# Patient Record
Sex: Male | Born: 1975 | Hispanic: Yes | Marital: Married | Smoking: Former smoker
Health system: Southern US, Community
[De-identification: ages and names within clinical notes are randomized; demographics above are authoritative.]

## PROBLEM LIST (undated history)

## (undated) DIAGNOSIS — R7303 Prediabetes: Secondary | ICD-10-CM

## (undated) DIAGNOSIS — B159 Hepatitis A without hepatic coma: Secondary | ICD-10-CM

## (undated) DIAGNOSIS — I10 Essential (primary) hypertension: Secondary | ICD-10-CM

## (undated) HISTORY — PX: NO PAST SURGERIES: SHX2092

---

## 2008-03-29 ENCOUNTER — Ambulatory Visit: Payer: Self-pay | Admitting: Family Medicine

## 2017-10-06 ENCOUNTER — Other Ambulatory Visit: Payer: Self-pay | Admitting: Family Medicine

## 2017-10-06 DIAGNOSIS — R945 Abnormal results of liver function studies: Secondary | ICD-10-CM

## 2017-10-13 ENCOUNTER — Ambulatory Visit: Payer: Self-pay

## 2017-10-13 ENCOUNTER — Ambulatory Visit
Admission: RE | Admit: 2017-10-13 | Discharge: 2017-10-13 | Disposition: A | Discharge status: Home / Self Care | Payer: BLUE CROSS/BLUE SHIELD | Source: Ambulatory Visit | Attending: Family Medicine | Admitting: Family Medicine

## 2017-10-13 ENCOUNTER — Other Ambulatory Visit: Payer: Self-pay | Admitting: Family Medicine

## 2017-10-13 DIAGNOSIS — D1809 Hemangioma of other sites: Secondary | ICD-10-CM | POA: Diagnosis not present

## 2017-10-13 DIAGNOSIS — R945 Abnormal results of liver function studies: Secondary | ICD-10-CM | POA: Insufficient documentation

## 2017-10-13 DIAGNOSIS — R932 Abnormal findings on diagnostic imaging of liver and biliary tract: Secondary | ICD-10-CM | POA: Diagnosis not present

## 2018-10-26 IMAGING — US US ABDOMEN COMPLETE
1 series · 13 of 25 positions shown · non-contrast
Comparison: None.

CLINICAL DATA: Abnormal liver function tests

EXAM:
ABDOMEN ULTRASOUND COMPLETE

[Series 1: us abdomen complete · 0.26mm/px · 13 of 114 slices shown]
[im 1/114]
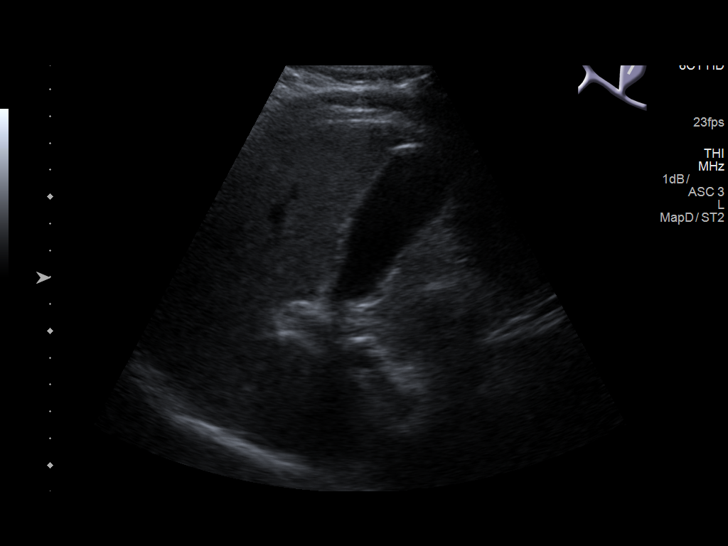
[im 10/114]
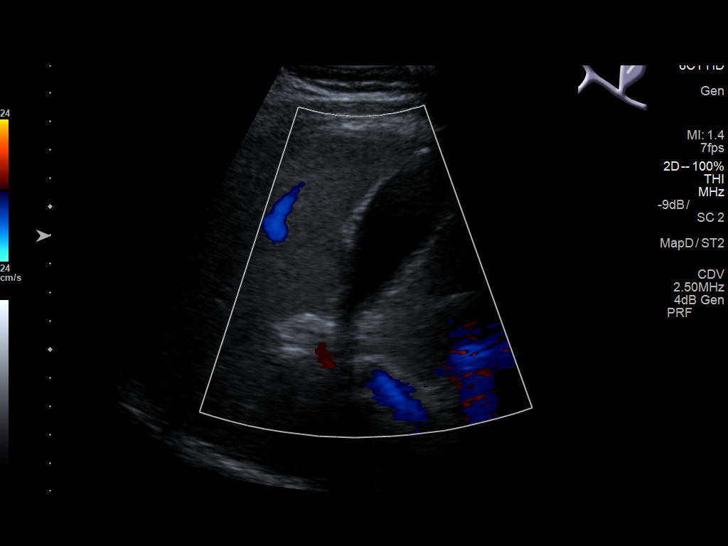
[im 19/114]
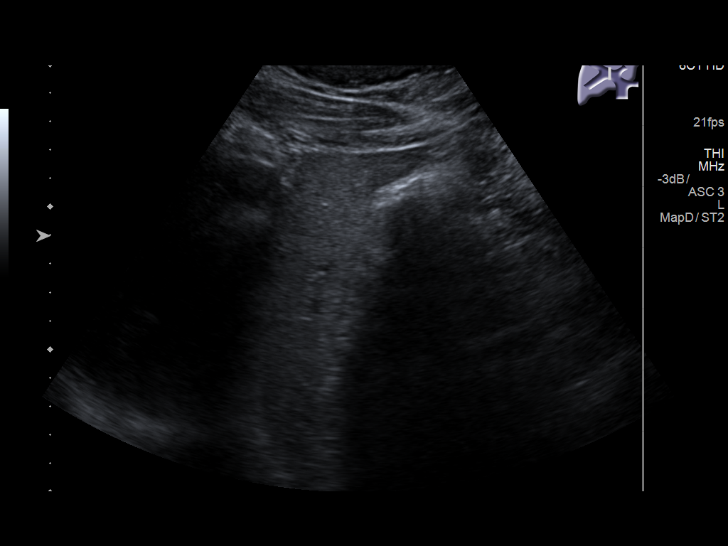
[im 29/114]
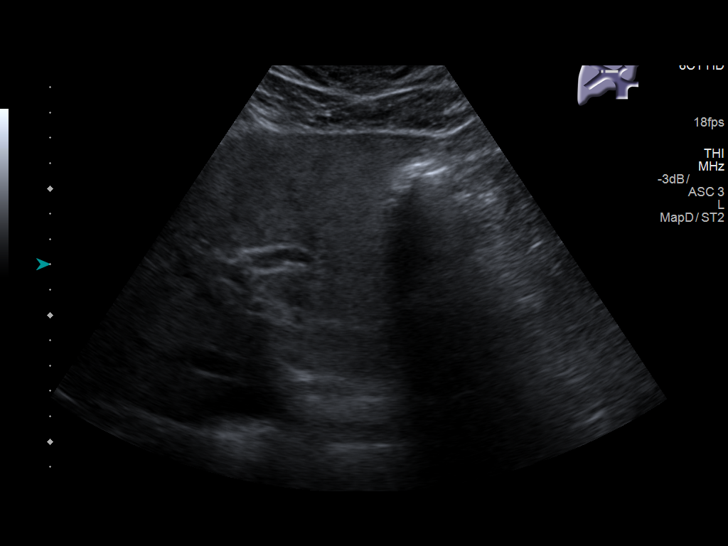
[im 38/114]
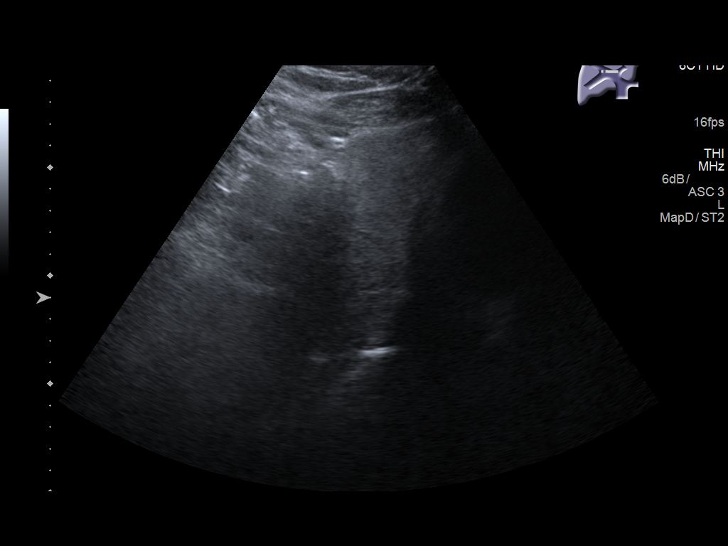
[im 48/114]
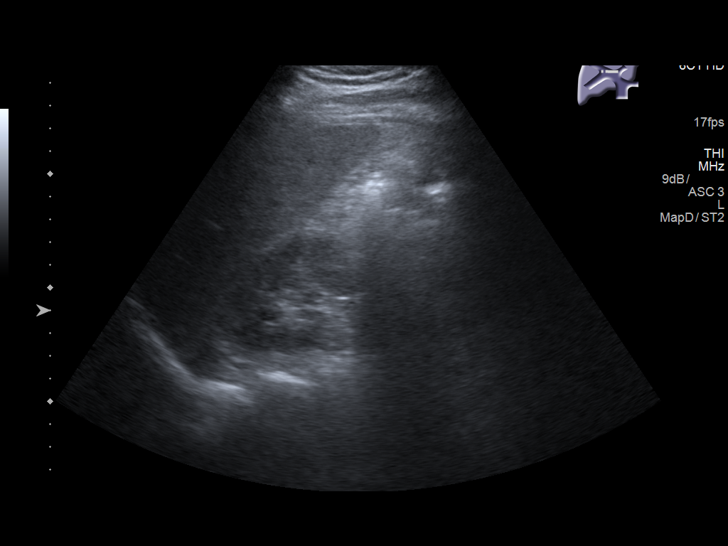
[im 57/114]
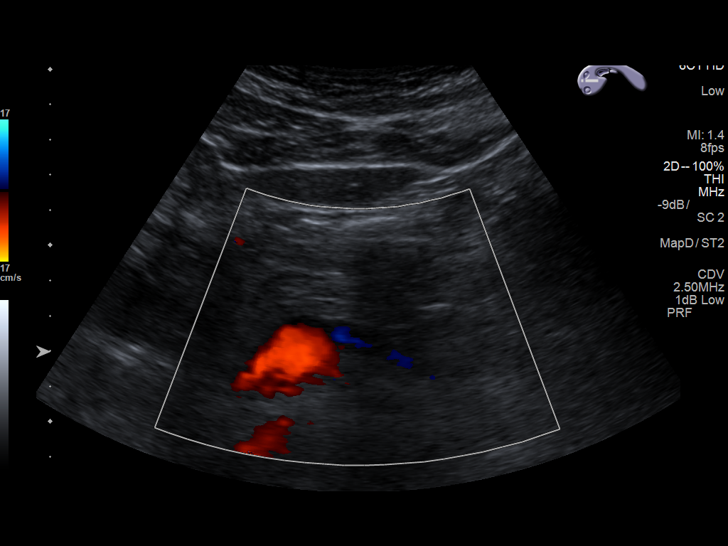
[im 66/114]
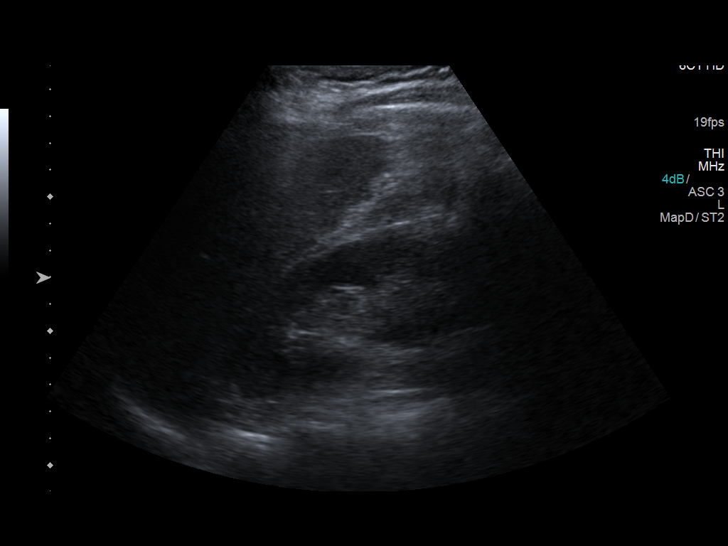
[im 76/114]
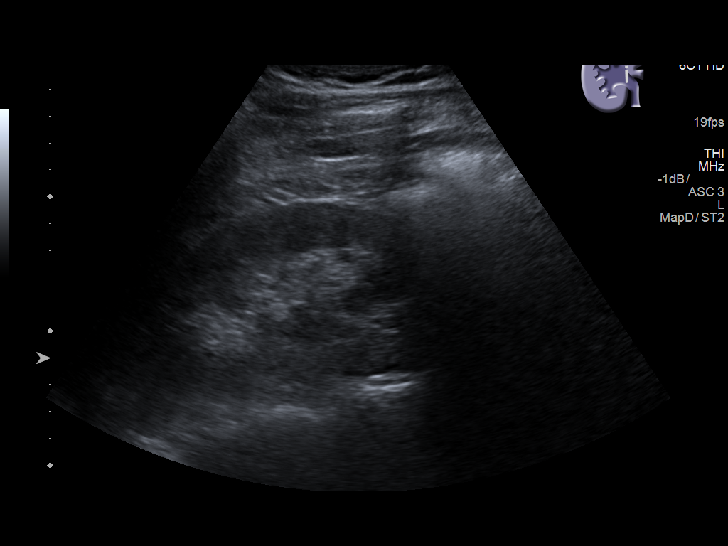
[im 85/114]
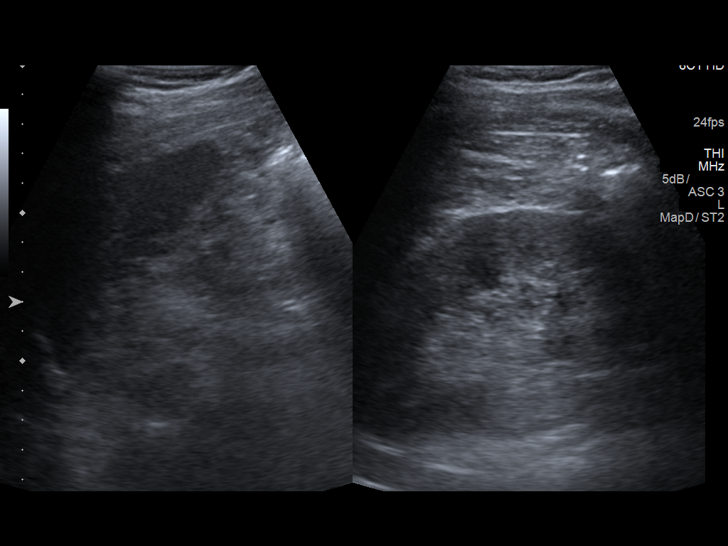
[im 95/114]
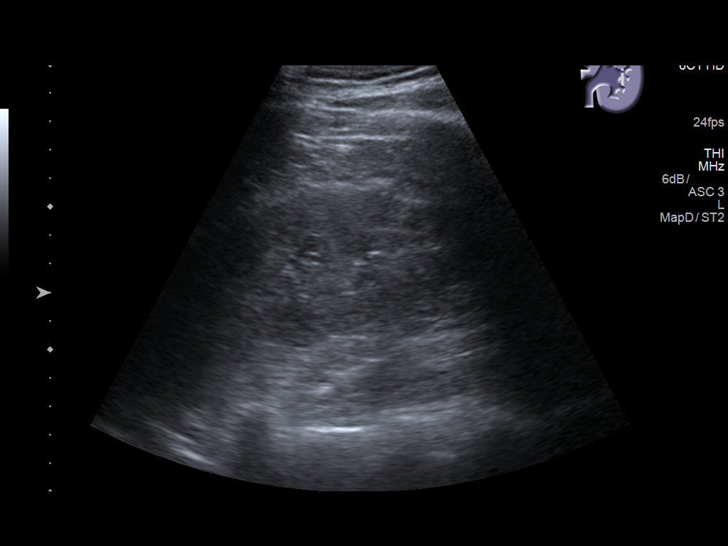
[im 104/114]
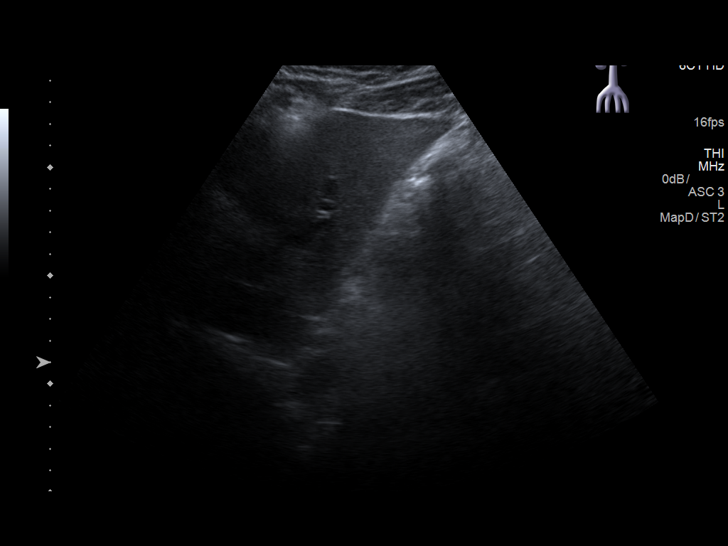
[im 114/114]
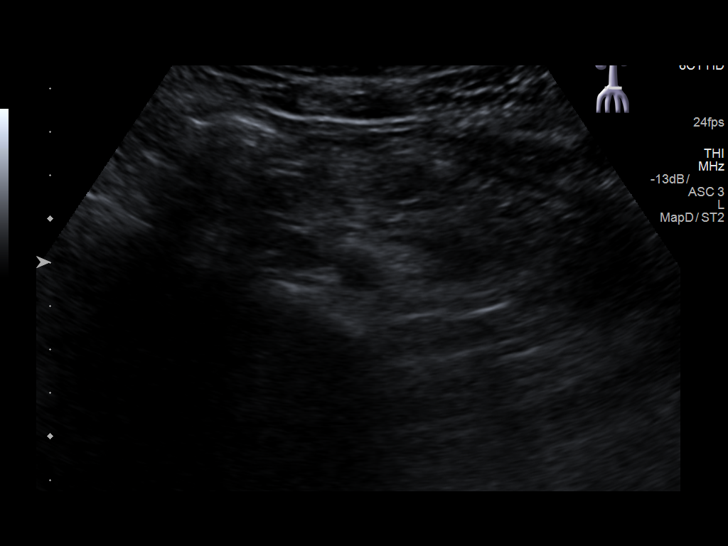

[13 of 25 positions shown; findings below may reference images not displayed]

FINDINGS: Gallbladder: The gallbladder is visualized and no gallstones are
noted. There is no pain over the gallbladder with compression.

Common bile duct: Diameter: The common bile duct is normal measuring
3 mm in diameter.

Liver: Parenchyma of the liver is somewhat echogenic consistent with
fatty infiltration. No focal hepatic abnormality is seen. On images
of the abdominal aorta longitudinally, there is a rounded echogenic
focus in the posterior aspect of a portion of the liver which could
represent a small hemangioma. Portal vein is patent on color Doppler
imaging with normal direction of blood flow towards the liver.

IVC: No abnormality visualized.

Pancreas: The pancreas is completely obscured by bowel gas and
cannot be evaluated.

Spleen: The spleen measures 7.5 cm.

Right Kidney: Length: 12.3 cm..  No hydronephrosis is seen.

Left Kidney: Length: 12.7 cm..  No hydronephrosis is noted.

Abdominal aorta: Much of the abdominal aorta is obscured by bowel
gas.

Other findings: None.
IMPRESSION: 1. No gallstones.
2. Echogenic liver parenchyma most consistent with diffuse fatty
infiltration. No focal hepatic abnormality.
3. The pancreas and abdominal aorta are obscured by bowel gas.
4. Question 11 mm hemangioma posteriorly in the liver as noted
above.

## 2019-08-14 ENCOUNTER — Other Ambulatory Visit: Payer: Self-pay

## 2019-08-14 DIAGNOSIS — Z20822 Contact with and (suspected) exposure to covid-19: Secondary | ICD-10-CM

## 2019-08-16 LAB — NOVEL CORONAVIRUS, NAA: SARS-CoV-2, NAA: NOT DETECTED

## 2020-02-08 ENCOUNTER — Other Ambulatory Visit (HOSPITAL_COMMUNITY): Payer: Self-pay | Admitting: Family Medicine

## 2020-02-08 ENCOUNTER — Other Ambulatory Visit: Payer: Self-pay | Admitting: Family Medicine

## 2020-02-08 DIAGNOSIS — I10 Essential (primary) hypertension: Secondary | ICD-10-CM | POA: Insufficient documentation

## 2020-02-08 DIAGNOSIS — R748 Abnormal levels of other serum enzymes: Secondary | ICD-10-CM

## 2020-02-08 DIAGNOSIS — R7303 Prediabetes: Secondary | ICD-10-CM | POA: Insufficient documentation

## 2020-02-08 DIAGNOSIS — E7800 Pure hypercholesterolemia, unspecified: Secondary | ICD-10-CM | POA: Insufficient documentation

## 2020-02-25 ENCOUNTER — Ambulatory Visit
Admission: RE | Admit: 2020-02-25 | Discharge: 2020-02-25 | Disposition: A | Discharge status: Home / Self Care | Payer: Self-pay | Source: Ambulatory Visit | Attending: Family Medicine | Admitting: Family Medicine

## 2020-02-25 ENCOUNTER — Other Ambulatory Visit: Payer: Self-pay

## 2020-02-25 DIAGNOSIS — R748 Abnormal levels of other serum enzymes: Secondary | ICD-10-CM | POA: Insufficient documentation

## 2021-05-05 DIAGNOSIS — R0989 Other specified symptoms and signs involving the circulatory and respiratory systems: Secondary | ICD-10-CM | POA: Insufficient documentation

## 2021-05-26 ENCOUNTER — Other Ambulatory Visit: Payer: Self-pay | Admitting: Family Medicine

## 2021-05-26 DIAGNOSIS — R748 Abnormal levels of other serum enzymes: Secondary | ICD-10-CM

## 2021-05-26 DIAGNOSIS — R7401 Elevation of levels of liver transaminase levels: Secondary | ICD-10-CM

## 2021-05-29 ENCOUNTER — Other Ambulatory Visit: Payer: Self-pay

## 2021-05-29 ENCOUNTER — Ambulatory Visit
Admission: RE | Admit: 2021-05-29 | Discharge: 2021-05-29 | Disposition: A | Discharge status: Home / Self Care | Payer: Self-pay | Source: Ambulatory Visit | Attending: Family Medicine | Admitting: Family Medicine

## 2021-05-29 DIAGNOSIS — R7401 Elevation of levels of liver transaminase levels: Secondary | ICD-10-CM

## 2021-05-29 DIAGNOSIS — R748 Abnormal levels of other serum enzymes: Secondary | ICD-10-CM

## 2021-06-04 ENCOUNTER — Encounter: Payer: Self-pay | Admitting: *Deleted

## 2021-08-04 ENCOUNTER — Ambulatory Visit (INDEPENDENT_AMBULATORY_CARE_PROVIDER_SITE_OTHER): Payer: Self-pay | Admitting: Gastroenterology

## 2021-08-04 ENCOUNTER — Other Ambulatory Visit: Payer: Self-pay

## 2021-08-04 ENCOUNTER — Encounter: Payer: Self-pay | Admitting: Gastroenterology

## 2021-08-04 VITALS — BP 132/83 | HR 88 | Ht 72.0 in | Wt 226.2 lb

## 2021-08-04 DIAGNOSIS — R748 Abnormal levels of other serum enzymes: Secondary | ICD-10-CM

## 2021-08-04 NOTE — Progress Notes (Signed)
Gastroenterology Consultation  Referring Provider:     Liane Comber, Delcie Roch * Primary Care Physician:  [facility] Primary Gastroenterologist:  Dr. Allen Norris     Reason for Consultation:     Abnormal liver enzymes        HPI:   Marvin Brewer is a 45 y.o. y/o male referred for consultation & management of abnormal liver enzymes by Dr. Domingo Madeira, [place].  This patient comes in today after being told that he had abnormal liver enzymes.  The patient reports that he has known about this for approximately the last year.  His labs were sent to me and his elevation was seen as far back as August of this year.  The patient then had a repeat later that month and his liver enzyme had dramatically decreased and they were repeated in September which showed them to decrease even more.  Despite that his AST is 99 with his ALT being 204 at his last blood work that I have.  The patient denies any alcohol abuse.  The patient also reports that he never used any IV drugs.  His hepatitis C antibody was negative as was his hepatitis B surface antigen and hepatitis A IgM.  He does report that he was told to start a better diet and he did that around the time that he was found to have abnormal liver enzymes and reports that he is also lost weight.  The patient had an ultrasound consistent with steatosis.  The patient denies any jaundice nausea vomiting black stools or bloody stools.  He also denies any change in bowel habits.  The entire interview was taken from an interpreter since the patient does not speak English well.  No past medical history on file.    Prior to Admission medications   Medication Sig Start Date End Date Taking? Authorizing Provider  famotidine (PEPCID) 20 MG tablet famotidine 20 mg tablet  TAKE 1 TABLET BY MOUTH TWICE DAILY   Yes [provider]  lisinopril (ZESTRIL) 10 MG tablet Take 10 mg by mouth daily. 04/14/21  Yes [provider]  hydrocortisone 2.5 % cream hydrocortisone 2.5 % topical cream with perineal applicator  INSERT INTO THE RECTUM TWICE DAILY FOR 5 DAYS Patient not taking: Reported on 08/04/2021    [provider]    No family history on file.   Social History   Tobacco Use   Smoking status: Former    Types: Cigarettes   Smokeless tobacco: Never  Substance Use Topics   Alcohol use: Never   Drug use: Never    Allergies as of 08/04/2021   (No Known Allergies)    Review of Systems:    All systems reviewed and negative except where noted in HPI.   Physical Exam:  BP 132/83 (BP Location: Right Arm, Patient Position: Sitting, Cuff Size: Large)   Pulse 88   Ht 6' (1.829 m)   Wt 226 lb 3.2 oz (102.6 kg)   BMI 30.68 kg/m  No LMP for male patient. General:   Alert,  Well-developed, well-nourished, pleasant and cooperative in NAD Head:  Normocephalic and atraumatic. Eyes:  Sclera clear, no icterus.   Conjunctiva pink. Ears:  Normal auditory acuity. Neck:  Supple; no masses or thyromegaly. Lungs:  Respirations even and unlabored.  Clear throughout to auscultation.   No wheezes, crackles, or rhonchi. No acute distress. Heart:  Regular rate and rhythm; no murmurs, clicks, rubs, or gallops. Abdomen:  Normal  bowel sounds.  No bruits.  Soft, non-tender and non-distended without masses, hepatosplenomegaly or hernias noted.  No guarding or rebound tenderness.  Negative Carnett sign.   Rectal:  Deferred.  Pulses:  Normal pulses noted. Extremities:  No clubbing or edema.  No cyanosis. Neurologic:  Alert and oriented x3;  grossly normal neurologically. Skin:  Intact without significant lesions or rashes.  No jaundice. Lymph Nodes:  No significant cervical adenopathy. Psych:  Alert and cooperative. Normal mood and affect.  Imaging Studies: No results found.  Assessment and Plan:   Marvin Brewer is a 45 y.o. y/o male who comes in with abnormal liver enzymes with his  iron studies being normal, his acute hepatitis panel being normal and an ultrasound showing fatty liver.  The patient will have his lab sent for other possible causes of abnormal liver enzymes including ANA, SMA, AMA and ceruloplasmin.  The patient will also be checked for his immunity to hepatitis A and hepatitis B and will be vaccinated accordingly.  He has been told that if the labs do not show a cause for his abnormal liver enzymes that he may need a liver biopsy.  The patient has been explained the plan and agrees with it.    Lucilla Lame, MD. [facility]    Note: This dictation was prepared with Dragon dictation along with smaller phrase technology. Any transcriptional errors that result from this process are unintentional.

## 2021-08-05 LAB — HEPATITIS B SURFACE ANTIBODY,QUALITATIVE: Hep B Surface Ab, Qual: NONREACTIVE

## 2021-08-05 LAB — CERULOPLASMIN: Ceruloplasmin: 18.2 mg/dL (ref 16.0–31.0)

## 2021-08-05 LAB — HEPATITIS A ANTIBODY, TOTAL: hep A Total Ab: POSITIVE — AB

## 2021-08-05 LAB — HEPATIC FUNCTION PANEL
ALT: 296 IU/L — ABNORMAL HIGH (ref 0–44)
AST: 151 IU/L — ABNORMAL HIGH (ref 0–40)
Albumin: 4.7 g/dL (ref 4.0–5.0)
Alkaline Phosphatase: 119 IU/L (ref 44–121)
Bilirubin Total: 0.4 mg/dL (ref 0.0–1.2)
Bilirubin, Direct: 0.11 mg/dL (ref 0.00–0.40)
Total Protein: 7.8 g/dL (ref 6.0–8.5)

## 2021-08-05 LAB — ANTI-SMOOTH MUSCLE ANTIBODY, IGG: Smooth Muscle Ab: 5 Units (ref 0–19)

## 2021-08-05 LAB — ALPHA-1-ANTITRYPSIN: A-1 Antitrypsin: 125 mg/dL (ref 101–187)

## 2021-08-26 ENCOUNTER — Telehealth: Payer: Self-pay

## 2021-08-26 DIAGNOSIS — R748 Abnormal levels of other serum enzymes: Secondary | ICD-10-CM

## 2021-08-26 NOTE — Telephone Encounter (Signed)
Called patient and left him a voicemail letting him know that he will need blood drawn and a hepatitis B vaccine. Awaiting on his call back.

## 2021-08-26 NOTE — Telephone Encounter (Signed)
-----   Message from Glennie Isle, [place] sent at 08/24/2021  4:30 PM EST ----- Would you mind calling this patient regarding these results. He speaks spanish.   Thank you! ----- Message ----- From: Lucilla Lame, MD Sent: 08/06/2021   8:25 AM EST To: Glennie Isle, CMA  Please let the patient know that his liver enzymes are Still quite elevated.  His ferritin was elevated and he should have his blood checked for the hemachromatosis gene.  If this is negative he will need a liver biopsy to find out why his liver enzymes are so elevated.  He is also not immune to hepatitis B and will need a hepatitis B vaccine.  He is immune to hepatitis A.

## 2021-08-27 ENCOUNTER — Ambulatory Visit (INDEPENDENT_AMBULATORY_CARE_PROVIDER_SITE_OTHER): Payer: Self-pay | Admitting: Gastroenterology

## 2021-08-27 DIAGNOSIS — Z23 Encounter for immunization: Secondary | ICD-10-CM

## 2021-08-27 NOTE — Telephone Encounter (Addendum)
Patient came to the office and had his lab drawn. I will also offer him the Hepatitis B vaccine while he is here. Patient agreed on getting the Hepatitis B vaccine.

## 2021-08-28 NOTE — Progress Notes (Signed)
Vaccine visit 

## 2021-09-03 LAB — HEMOCHROMATOSIS DNA-PCR(C282Y,H63D)

## 2021-09-07 ENCOUNTER — Ambulatory Visit: Payer: Self-pay

## 2021-09-08 ENCOUNTER — Encounter: Payer: Self-pay | Admitting: Gastroenterology

## 2021-09-16 NOTE — Addendum Note (Signed)
Addended by: Lurlean Nanny on: 09/16/2021 04:17 PM   Modules accepted: Orders

## 2021-09-24 ENCOUNTER — Other Ambulatory Visit: Payer: Self-pay

## 2021-09-24 ENCOUNTER — Ambulatory Visit (INDEPENDENT_AMBULATORY_CARE_PROVIDER_SITE_OTHER): Payer: Self-pay | Admitting: Gastroenterology

## 2021-09-24 DIAGNOSIS — Z23 Encounter for immunization: Secondary | ICD-10-CM

## 2021-09-24 NOTE — Progress Notes (Signed)
Patient presented to the clinic today to receive 2 of 3 Hep B vaccine, given in Right deltoid  Pt tolerated inj well and 3rd scheduled for May 2023

## 2021-09-28 ENCOUNTER — Telehealth: Payer: Self-pay

## 2021-09-28 NOTE — Telephone Encounter (Signed)
Marvin Brewer from special procedures called patient liver biopsy is schedule for 10/08/21 [facility]

## 2021-09-29 NOTE — Telephone Encounter (Signed)
Called patient to let him know in Spanish that he is to go to the [facility] on 10/08/21 at 8:30 AM for his liver biopsy and if he had any questions or concerns, to please call me back.

## 2021-10-06 ENCOUNTER — Other Ambulatory Visit: Payer: Self-pay | Admitting: Radiology

## 2021-10-06 NOTE — Progress Notes (Signed)
Patient on schedule for liver biopsy 2/2, was able after several attempts to get Marvin Brewer/nephew on phone with pre procedure instructions given. Made aware to be here @ 0900, NPO after MN prior to procedure as well as driver post procedure/recovery/discharge. Stated understanding.

## 2021-10-07 ENCOUNTER — Other Ambulatory Visit: Payer: Self-pay | Admitting: Radiology

## 2021-10-08 ENCOUNTER — Ambulatory Visit
Admission: RE | Admit: 2021-10-08 | Discharge: 2021-10-08 | Disposition: A | Discharge status: Home / Self Care | Payer: Self-pay | Source: Ambulatory Visit | Attending: Gastroenterology | Admitting: Gastroenterology

## 2021-10-08 ENCOUNTER — Other Ambulatory Visit: Payer: Self-pay

## 2021-10-08 DIAGNOSIS — R748 Abnormal levels of other serum enzymes: Secondary | ICD-10-CM

## 2021-10-08 DIAGNOSIS — D134 Benign neoplasm of liver: Secondary | ICD-10-CM | POA: Insufficient documentation

## 2021-10-08 DIAGNOSIS — K7589 Other specified inflammatory liver diseases: Secondary | ICD-10-CM | POA: Insufficient documentation

## 2021-10-08 HISTORY — DX: Essential (primary) hypertension: I10

## 2021-10-08 LAB — CBC
HCT: 46.6 % (ref 39.0–52.0)
Hemoglobin: 16 g/dL (ref 13.0–17.0)
MCH: 29.8 pg (ref 26.0–34.0)
MCHC: 34.3 g/dL (ref 30.0–36.0)
MCV: 86.8 fL (ref 80.0–100.0)
Platelets: 184 10*3/uL (ref 150–400)
RBC: 5.37 MIL/uL (ref 4.22–5.81)
RDW: 12.5 % (ref 11.5–15.5)
WBC: 4.2 10*3/uL (ref 4.0–10.5)
nRBC: 0 % (ref 0.0–0.2)

## 2021-10-08 LAB — PROTIME-INR
INR: 1 (ref 0.8–1.2)
Prothrombin Time: 13.4 seconds (ref 11.4–15.2)

## 2021-10-08 MED ORDER — MIDAZOLAM HCL 2 MG/2ML IJ SOLN
INTRAMUSCULAR | Status: AC
  Filled 2021-10-08: qty 2

## 2021-10-08 MED ORDER — FENTANYL CITRATE (PF) 100 MCG/2ML IJ SOLN
INTRAMUSCULAR | Status: AC | PRN
  Administered 2021-10-08 (×2): 50 ug via INTRAVENOUS

## 2021-10-08 MED ORDER — MIDAZOLAM HCL 5 MG/5ML IJ SOLN
INTRAMUSCULAR | Status: AC | PRN
  Administered 2021-10-08 (×2): 1 mg via INTRAVENOUS

## 2021-10-08 MED ORDER — OXYCODONE HCL 5 MG PO TABS: 5.0000 mg | ORAL_TABLET | ORAL | Status: DC | PRN

## 2021-10-08 MED ORDER — SODIUM CHLORIDE 0.9 % IV SOLN: INTRAVENOUS | Status: DC

## 2021-10-08 MED ORDER — FENTANYL CITRATE (PF) 100 MCG/2ML IJ SOLN
INTRAMUSCULAR | Status: AC
  Filled 2021-10-08: qty 2

## 2021-10-08 NOTE — Procedures (Signed)
Interventional Radiology Procedure:   Indications: Elevated liver enzymes  Procedure: US guided liver biopsy  Findings: 3 core biopsies from right hepatic lobe.  Gelfoam injected as needle removed.   Complications: No immediate complications noted.     EBL: Minimal  Plan: Bedrest 3 hours   Jonerik Sliker R. Anselm Pancoast, MD  Pager: [phone]

## 2021-10-08 NOTE — H&P (Signed)
Chief Complaint: Patient was seen in consultation today for liver biopsy  at the request of [place]  Referring Physician(s): [place]  Supervising Physician: Markus Daft  Patient Status: [facility] - Out-pt  History of Present Illness: Marvin Brewer is a 46 y.o. male with no significant past medical history. Pt was referred to IR for liver biopsy d/t abnormal liver enzymes by Dr. Lucilla Lame.   Allergies: Patient has no known allergies.  Medications: Prior to Admission medications   Medication Sig Start Date End Date Taking? Authorizing Provider  famotidine (PEPCID) 20 MG tablet famotidine 20 mg tablet  TAKE 1 TABLET BY MOUTH TWICE DAILY    [provider]  hydrocortisone 2.5 % cream hydrocortisone 2.5 % topical cream with perineal applicator  INSERT INTO THE RECTUM TWICE DAILY FOR 5 DAYS Patient not taking: Reported on 08/04/2021    [provider]  lisinopril (ZESTRIL) 10 MG tablet Take 10 mg by mouth daily. 04/14/21   [provider]     History reviewed. No pertinent family history.  Social History   Socioeconomic History   Marital status: Married    Spouse name: Not on file   Number of children: Not on file   Years of education: Not on file   Highest education level: Not on file  Occupational History   Not on file  Tobacco Use   Smoking status: Former    Types: Cigarettes   Smokeless tobacco: Never  Vaping Use   Vaping Use: Never used  Substance and Sexual Activity   Alcohol use: Never   Drug use: Never   Sexual activity: Not on file  Other Topics Concern   Not on file  Social History Narrative   Not on file   Social Determinants of Health   Financial Resource Strain: Not on file  Food Insecurity: Not on file  Transportation Needs: Not on file  Physical Activity: Not on file  Stress: Not on file  Social Connections: Not on file     Review of Systems: A 12 point ROS discussed and pertinent positives are  indicated in the HPI above.  All other systems are negative.  Review of Systems  Constitutional:  Negative for appetite change, chills and fever.  HENT:  Negative for nosebleeds.   Eyes:  Negative for visual disturbance.  Respiratory:  Negative for cough and shortness of breath.   Cardiovascular:  Negative for chest pain and leg swelling.  Gastrointestinal:  Negative for abdominal pain, blood in stool, nausea and vomiting.  Genitourinary:  Negative for hematuria.  Neurological:  Negative for dizziness, light-headedness and headaches.   Vital Signs: BP (!) 148/97    Pulse (!) 58    Temp 98.1 F (36.7 C) (Oral)    Resp 14    Ht 5\' 6"  (1.676 m)    Wt 190 lb (86.2 kg)    SpO2 99%    BMI 30.67 kg/m   Physical Exam Constitutional:      Appearance: Normal appearance. He is not ill-appearing.  HENT:     Head: Normocephalic and atraumatic.     Mouth/Throat:     Mouth: Mucous membranes are moist.     Pharynx: Oropharynx is clear.  Cardiovascular:     Rate and Rhythm: Regular rhythm. Bradycardia present.     Pulses: Normal pulses.     Heart sounds: Normal heart sounds. No murmur heard.   No friction rub. No gallop.  Pulmonary:     Effort: Pulmonary effort is normal. No  respiratory distress.     Breath sounds: Normal breath sounds. No stridor. No wheezing, rhonchi or rales.  Abdominal:     General: Abdomen is flat. Bowel sounds are normal. There is no distension.     Palpations: Abdomen is soft.     Tenderness: There is no abdominal tenderness. There is no guarding.  Musculoskeletal:     Right lower leg: No edema.     Left lower leg: No edema.  Skin:    General: Skin is warm and dry.  Neurological:     Mental Status: He is alert and oriented to person, place, and time.  Psychiatric:        Mood and Affect: Mood normal.        Behavior: Behavior normal.        Thought Content: Thought content normal.        Judgment: Judgment normal.    Imaging: No results  found.  Labs:  CBC: No results for input(s): WBC, HGB, HCT, PLT in the last 8760 hours.  COAGS: No results for input(s): INR, APTT in the last 8760 hours.  BMP: No results for input(s): NA, K, CL, CO2, GLUCOSE, BUN, CALCIUM, CREATININE, GFRNONAA, GFRAA in the last 8760 hours.  Invalid input(s): CMP  LIVER FUNCTION TESTS: Recent Labs    08/04/21 1511  BILITOT 0.4  AST 151*  ALT 296*  ALKPHOS 119  PROT 7.8  ALBUMIN 4.7    TUMOR MARKERS: No results for input(s): AFPTM, CEA, CA199, CHROMGRNA in the last 8760 hours.  Assessment and Plan: Pt has no significant past medical history. Pt was referred to IR for liver biopsy d/t abnormal liver enzymes by Dr. Lucilla Lame.   Pt resting on stretcher with wife at bedside.  He is A&O, calm and pleasant.  With [facility] health appointed interpreter, pt states he is NPO per order.  Pt takes milk thistle and combination supplement for immune support that contains nettle, buchu, garlic and omega 3's.   Risks and benefits of liver biopsy was discussed through interpreter with the patient and/or patient's family including, but not limited to bleeding, infection, damage to adjacent structures or low yield requiring additional tests.  All of the questions were answered and there is agreement to proceed.  Consent signed and in chart.   Thank you for this interesting consult.  I greatly enjoyed meeting Herbert Marken and look forward to participating in their care.  A copy of this report was sent to the requesting provider on this date.  Electronically Signed: Tyson Alias, NP 10/08/2021, 10:16 AM   I spent a total of 20 minutes in face to face in clinical consultation, greater than 50% of which was counseling/coordinating care for liver biopsy.

## 2021-10-12 LAB — SURGICAL PATHOLOGY

## 2021-11-30 ENCOUNTER — Telehealth: Payer: Self-pay

## 2021-11-30 NOTE — Telephone Encounter (Signed)
-----   Message from [place] sent at 11/30/2021  4:00 PM EDT ----- ?Can you please call patient to schedule a f/u, He can be fit in on any Tues, Thurs that has less than 8 patients ?

## 2021-11-30 NOTE — Telephone Encounter (Signed)
Called patient to schedule him a follow up appointment per Dr. Dorothey Baseman request. Patient agreed and he will come in on 12/15/2021 at 1:30 PM. Patient had no further questions. Patient was also notified that I would be mailing his appointment information so he doesn't forget. Interpreter requested for appointment. ?

## 2021-12-15 ENCOUNTER — Ambulatory Visit (INDEPENDENT_AMBULATORY_CARE_PROVIDER_SITE_OTHER): Payer: Self-pay | Admitting: Gastroenterology

## 2021-12-15 ENCOUNTER — Encounter: Payer: Self-pay | Admitting: Gastroenterology

## 2021-12-15 VITALS — BP 155/97 | HR 73 | Temp 98.3°F | Wt 193.0 lb

## 2021-12-15 DIAGNOSIS — K759 Inflammatory liver disease, unspecified: Secondary | ICD-10-CM

## 2021-12-15 NOTE — Progress Notes (Signed)
? ? ?Primary Care Physician: System, Provider Not In ? ?Primary Gastroenterologist:  Dr. Lucilla Lame ? ?Chief Complaint  ?Patient presents with  ? Follow-up  ? ? ?HPI: Marvin Brewer is a 46 y.o. male here for follow-up of his liver biopsy.  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  The patient denies any abdominal pain at the present time.  The patient is concerned because she does not have any insurance.  He has also not ever had a colonoscopy in the past.  The patient's blood work in the past has been negative for any cause for his abnormal liver enzymes.  He has also not had any recent blood work done. ? ?Past Medical History:  ?Diagnosis Date  ? Hypertension   ? ? ?Current Outpatient Medications  ?Medication Sig Dispense Refill  ? famotidine (PEPCID) 20 MG tablet famotidine 20 mg tablet ? TAKE 1 TABLET BY MOUTH TWICE DAILY    ? hydrocortisone 2.5 % cream     ? lisinopril (ZESTRIL) 10 MG tablet Take 10 mg by mouth daily.    ? milk thistle 175 MG tablet Take 175 mg by mouth daily.    ? ?No current facility-administered medications for this visit.  ? ? ?Allergies as of 12/15/2021  ? (No Known Allergies)  ? ? ?ROS: ? ?General: Negative for anorexia, weight loss, fever, chills, fatigue, weakness. ?ENT: Negative for hoarseness, difficulty swallowing , nasal congestion. ?CV: Negative for chest pain, angina, palpitations, dyspnea on exertion, peripheral edema.  ?Respiratory: Negative for dyspnea at rest, dyspnea on exertion, cough, sputum, wheezing.  ?GI: See history of present illness. ?GU:  Negative for dysuria, hematuria, urinary incontinence, urinary frequency, nocturnal urination.  ?Endo: Negative for unusual weight change.  ?  ?Physical Examination: ? ? BP (!) 155/97   Pulse 73   Temp 98.3 ?F (36.8 ?C) (Oral)   Wt 193 lb (87.5 kg)   BMI 31.15 kg/m?  ? ?General: Well-nourished, well-developed in no acute distress.   ?Eyes: No icterus. Conjunctivae pink. ?Lungs: Clear to auscultation bilaterally. Non-labored. ?Heart: Regular rate and rhythm, no murmurs rubs or gallops.  ?Abdomen: Bowel sounds are normal, nontender, nondistended, no hepatosplenomegaly or masses, no abdominal bruits or hernia , no rebound or guarding.   ?Extremities: No lower extremity edema. No clubbing or deformities. ?Neuro: Alert and oriented x 3.  Grossly intact. ?Skin: Warm and dry, no jaundice.   ?Psych: Alert and cooperative, normal mood and affect. ? ?Labs:  ?  ?Imaging Studies: ?No results found. ? ?Assessment and Plan:  ? ?Marvin Brewer is a 46 y.o. 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  The patient has been told that he needs some blood work sent off and a screening colonoscopy but is hesitant to do any of this because of his lack of insurance.  The patient will check with his work to see if he can get insurance and for the time being will have his blood work sent off for just liver enzymes and immunoglobulins.  The patient has been told that if everything comes back negative he may need to be started on a trial of steroids to see if that improves his liver enzymes.  The patient has been explained the plan agrees with it. ? ? ? ? ?Lucilla Lame, MD. [facility] ? ? ?  Note: This dictation was prepared with Dragon dictation along with smaller phrase technology. Any transcriptional errors that result from this process are unintentional.  ?

## 2021-12-16 LAB — HEPATIC FUNCTION PANEL
ALT: 183 IU/L — ABNORMAL HIGH (ref 0–44)
AST: 98 IU/L — ABNORMAL HIGH (ref 0–40)
Albumin: 4.7 g/dL (ref 4.0–5.0)
Alkaline Phosphatase: 136 IU/L — ABNORMAL HIGH (ref 44–121)
Bilirubin Total: 0.3 mg/dL (ref 0.0–1.2)
Bilirubin, Direct: 0.12 mg/dL (ref 0.00–0.40)
Total Protein: 7.8 g/dL (ref 6.0–8.5)

## 2021-12-16 LAB — IGG 4: IgG, Subclass 4: 47 mg/dL (ref 2–96)

## 2022-01-26 ENCOUNTER — Ambulatory Visit: Payer: Self-pay

## 2022-07-21 ENCOUNTER — Other Ambulatory Visit: Payer: Self-pay | Admitting: Physician Assistant

## 2022-07-21 DIAGNOSIS — R945 Abnormal results of liver function studies: Secondary | ICD-10-CM

## 2022-07-21 DIAGNOSIS — K7689 Other specified diseases of liver: Secondary | ICD-10-CM

## 2022-07-21 IMAGING — US US ABDOMEN LIMITED
1 series · 14 of 25 positions shown · non-contrast
Comparison: Prior ultrasound from 02/25/2020.

CLINICAL DATA: Initial evaluation for elevated liver enzymes.

EXAM:
ULTRASOUND ABDOMEN LIMITED RIGHT UPPER QUADRANT

[Series 1: us abdomen limited · 0.23mm/px · 14 of 43 slices shown]
[im 1/43]
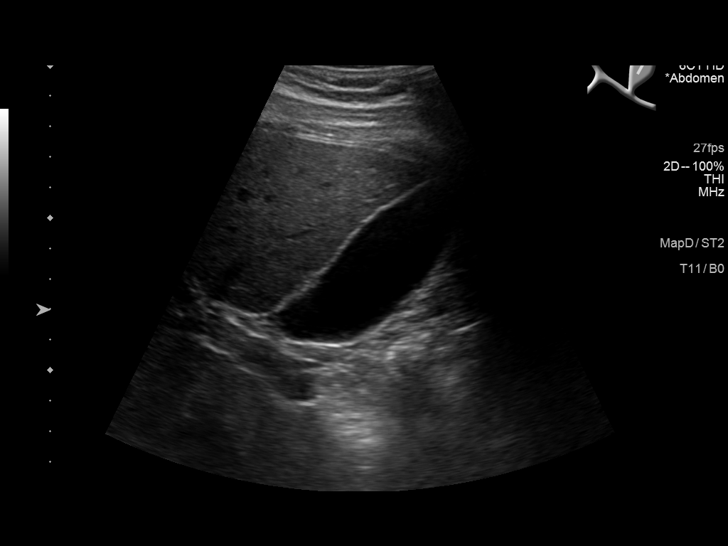
[im 4/43]
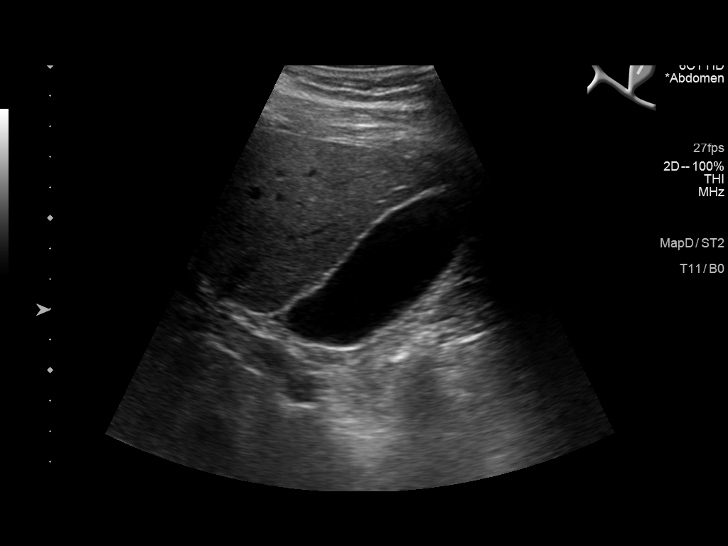
[im 8/43]
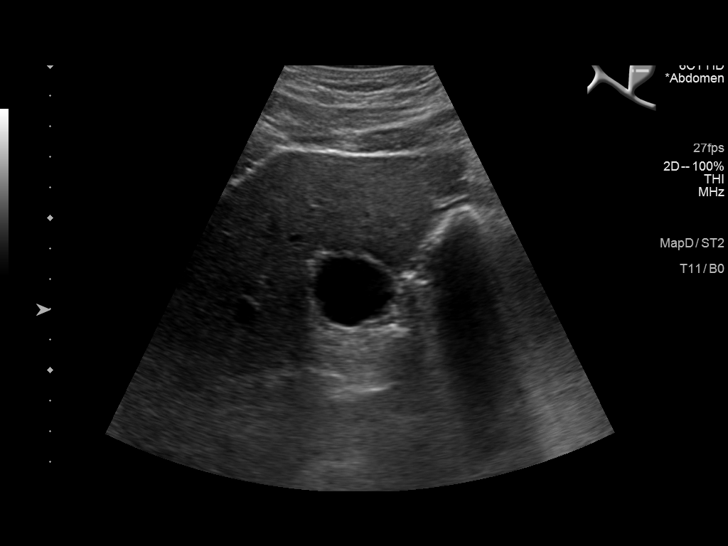
[im 11/43]
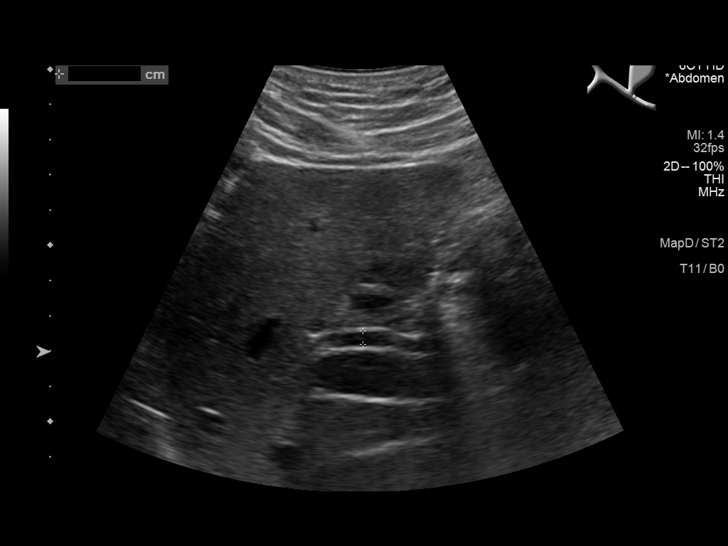
[im 15/43]
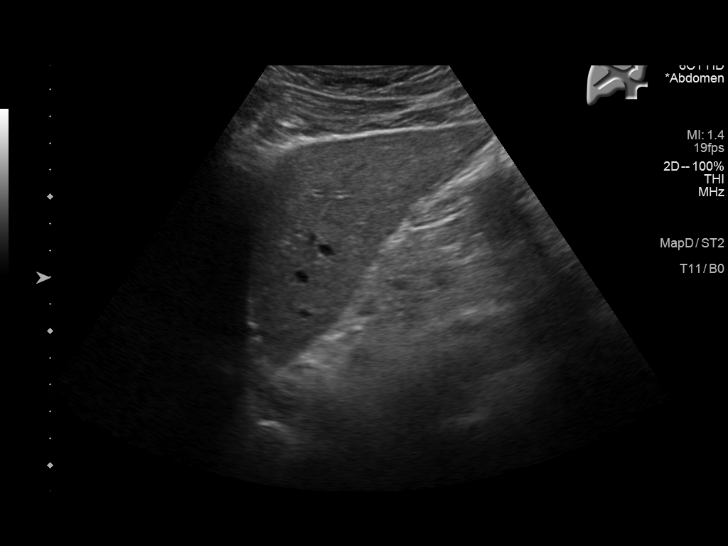
[im 16/43]
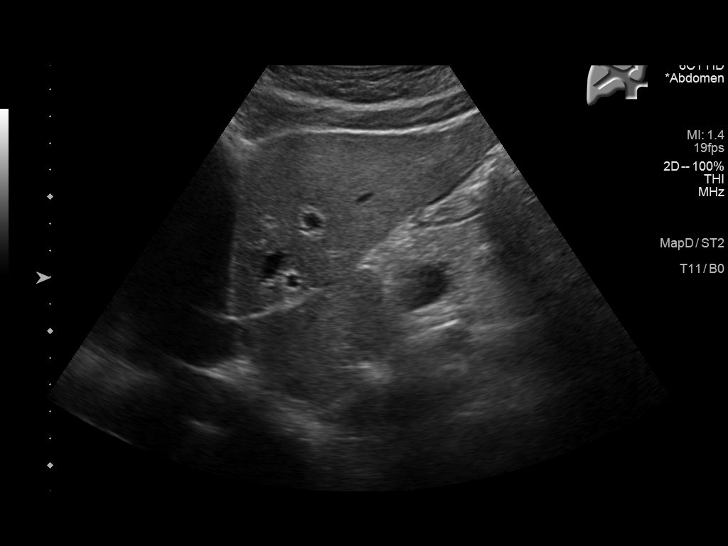
[im 20/43]
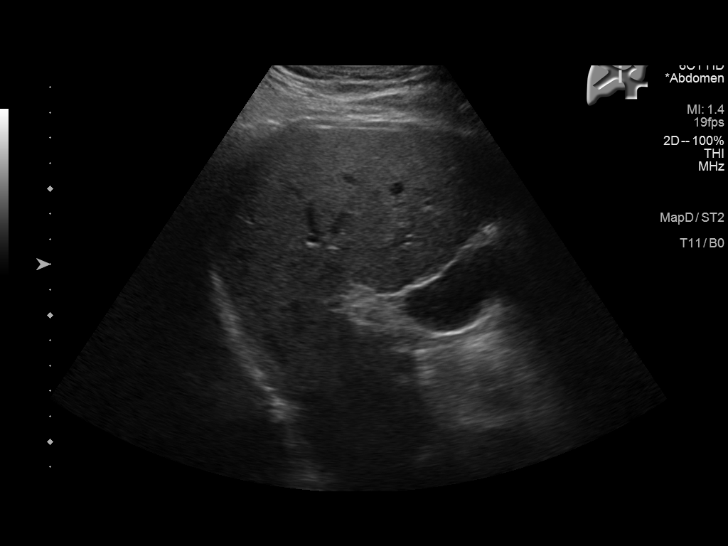
[im 23/43]
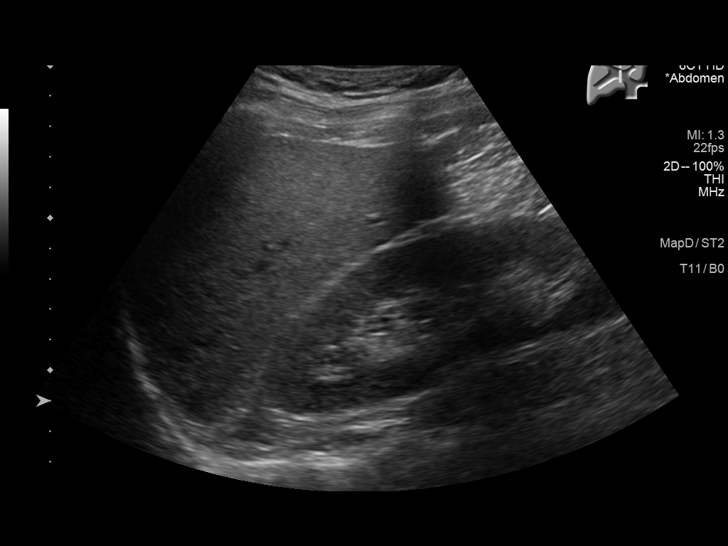
[im 27/43]
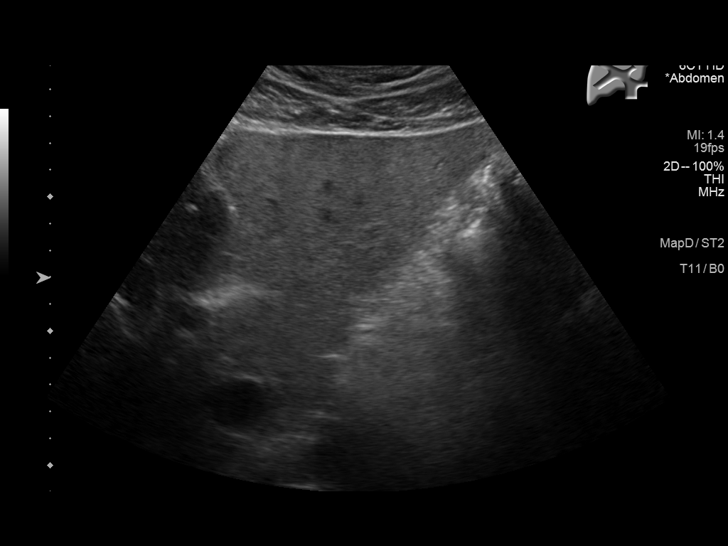
[im 29/43]
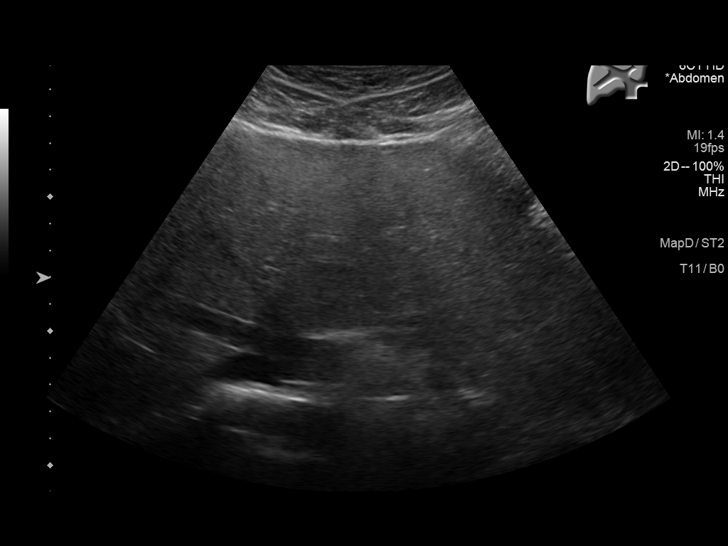
[im 32/43]
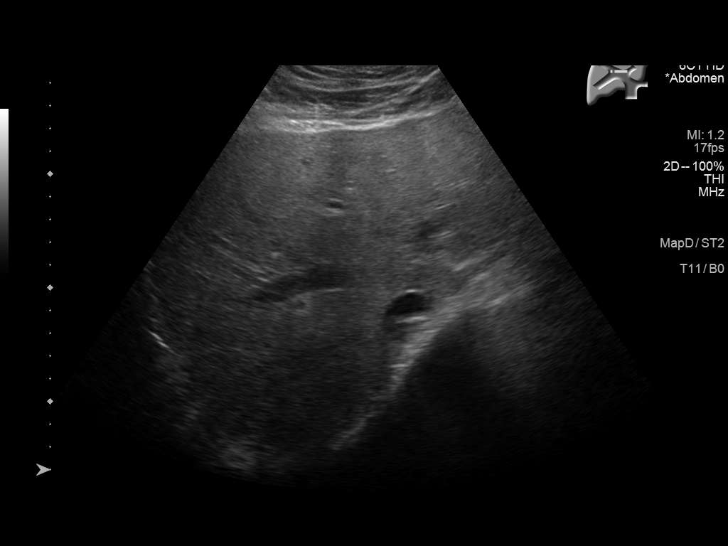
[im 36/43]
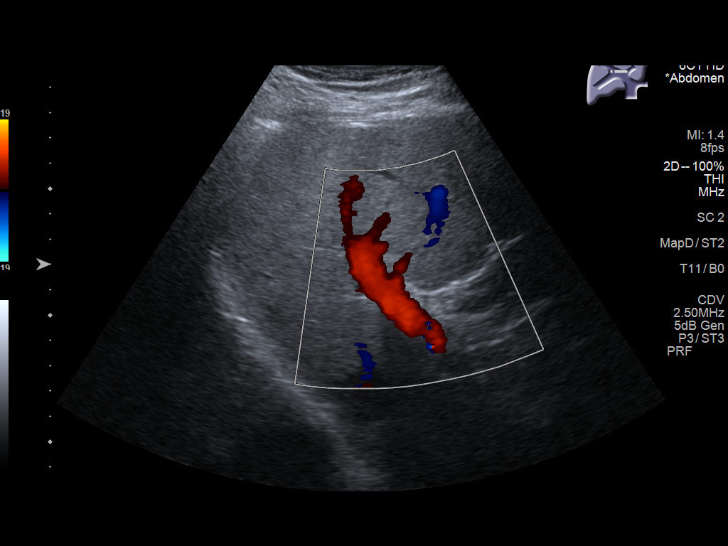
[im 39/43]
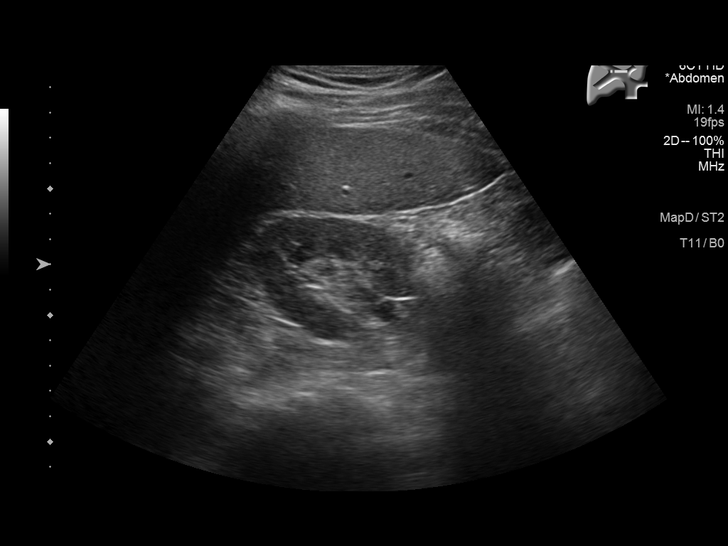
[im 43/43]
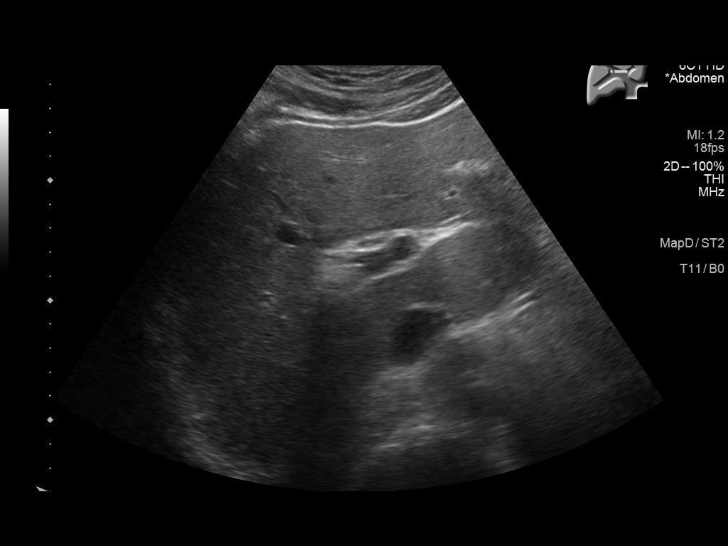

[14 of 25 positions shown; findings below may reference images not displayed]

FINDINGS: Gallbladder:

No stones or sludge seen within the lumen. Gallbladder wall measures
within normal limits at 2.3 mm. No free pericholecystic fluid. No
sonographic Murphy sign elicited on exam.

Common bile duct:

Diameter: 3.8 mm

Liver:

No focal lesion identified. Mildly coarsened and somewhat echogenic
echotexture, suggesting steatosis. Portal vein is patent on color
Doppler imaging with normal direction of blood flow towards the
liver.

Other: None.
IMPRESSION: 1. Mildly coarsened and echogenic echotexture of the liver,
suggesting steatosis. Appearance is relatively stable from previous.
2. Normal sonographic appearance of the gallbladder. No
cholelithiasis, evidence for acute cholecystitis, or biliary
dilatation.

## 2022-07-27 ENCOUNTER — Ambulatory Visit
Admission: RE | Admit: 2022-07-27 | Discharge: 2022-07-27 | Disposition: A | Discharge status: Home / Self Care | Payer: Managed Care, Other (non HMO) | Source: Ambulatory Visit | Attending: Physician Assistant | Admitting: Physician Assistant

## 2022-07-27 DIAGNOSIS — K7689 Other specified diseases of liver: Secondary | ICD-10-CM | POA: Insufficient documentation

## 2022-07-27 DIAGNOSIS — R945 Abnormal results of liver function studies: Secondary | ICD-10-CM | POA: Diagnosis present

## 2022-11-30 IMAGING — US US BIOPSY CORE LIVER
1 series · 10 of 10 positions shown · non-contrast
Comparison: none

INDICATION: 45-year-old with elevated liver enzymes.  Request for liver biopsy.

[Series 1: us biopsy core liver · 0.25mm/px · 10 of 10 slices shown]
[im 1/10]
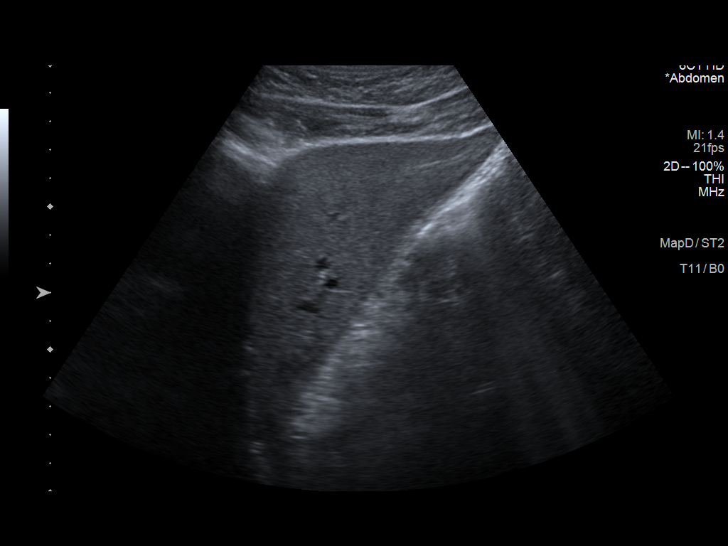
[im 2/10]
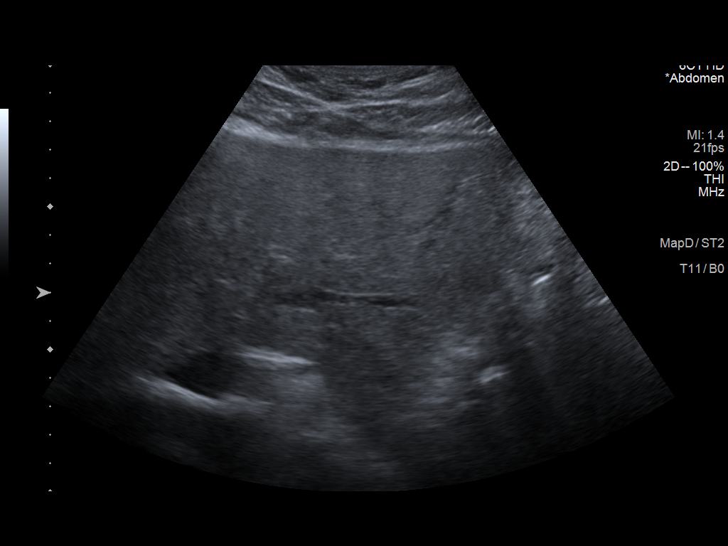
[im 3/10]
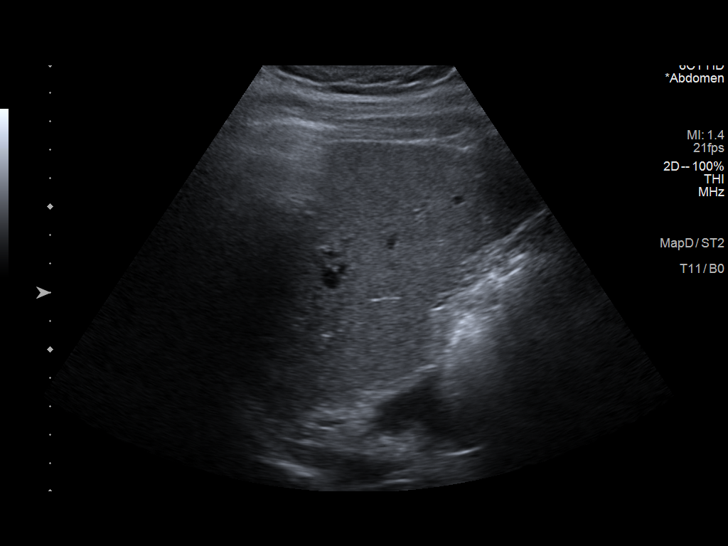
[im 4/10]
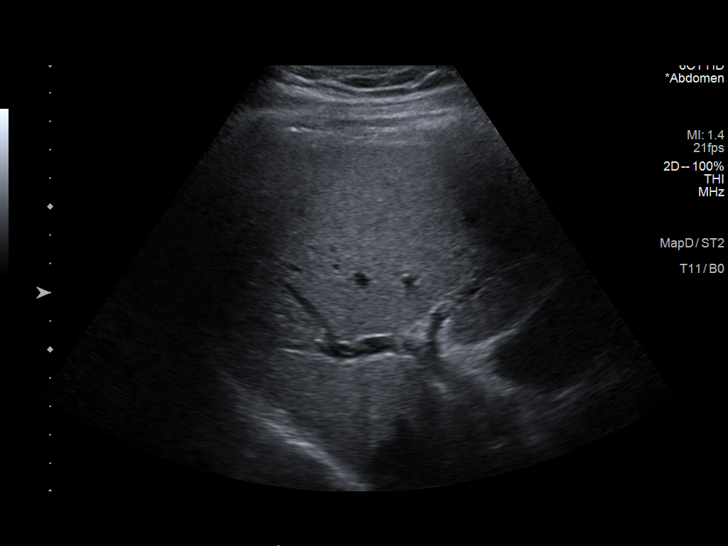
[im 5/10]
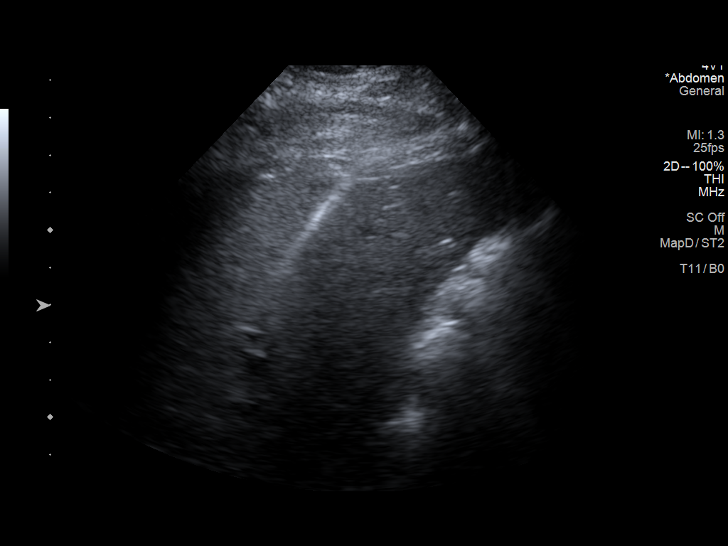
[im 6/10]
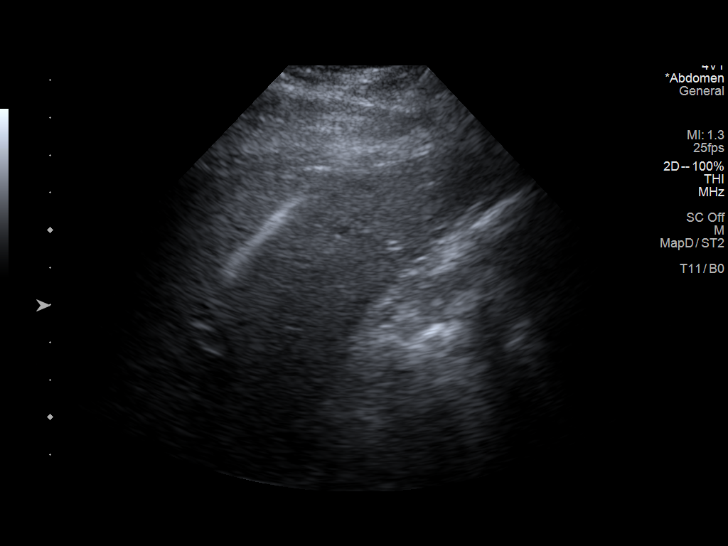
[im 7/10]
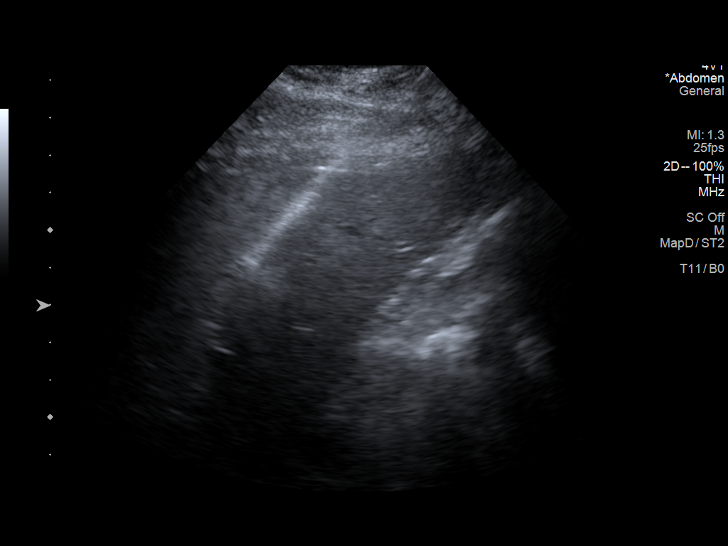
[im 8/10]
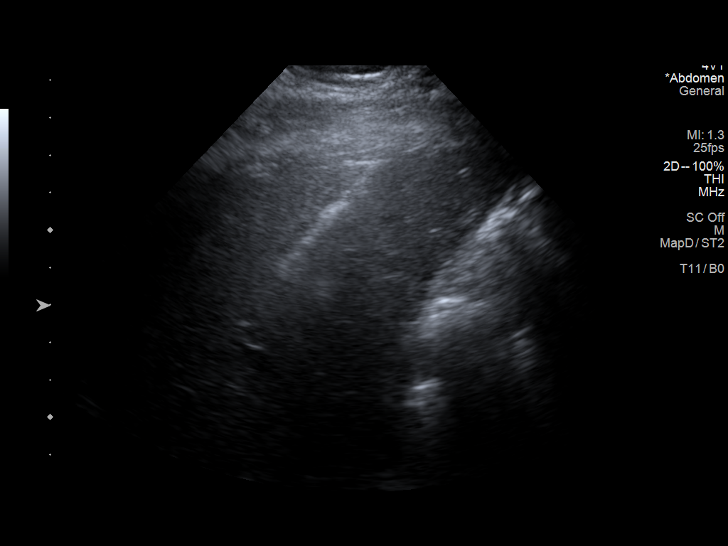
[im 9/10]
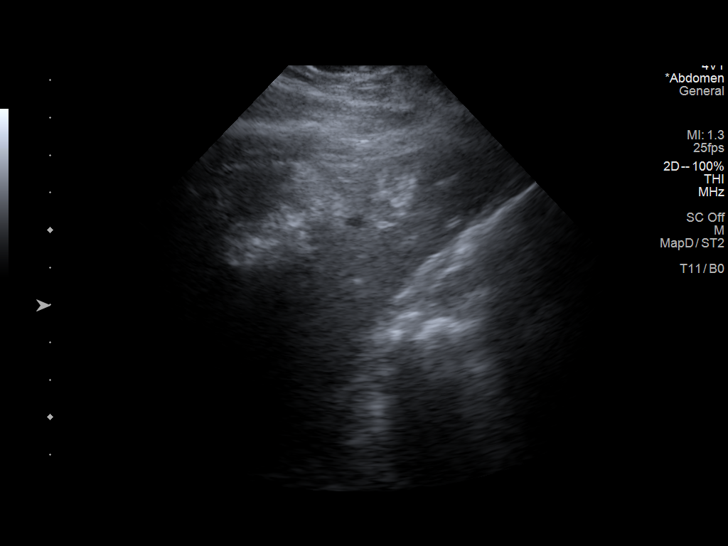
[im 10/10]
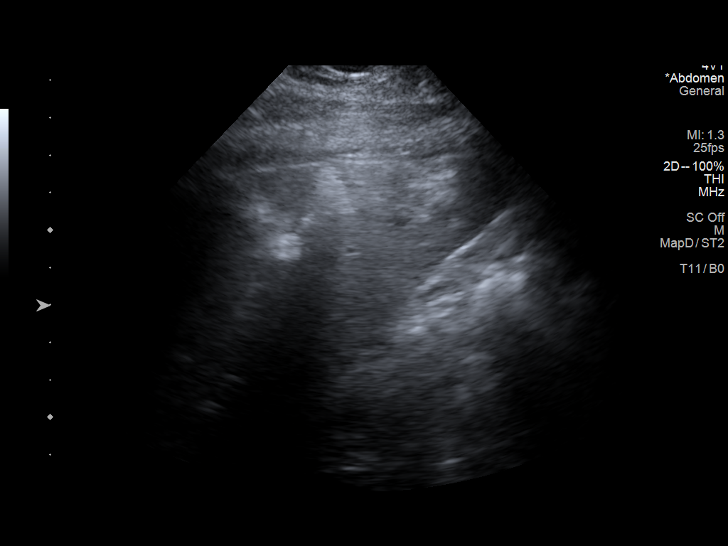

[10 of 10 positions shown; findings below may reference images not displayed]

EXAM:
ULTRASOUND-GUIDED LIVER BIOPSY

MEDICATIONS:
Moderate sedation

ANESTHESIA/SEDATION:
Moderate (conscious) sedation was employed during this procedure. A
total of Versed 2.0mg and fentanyl 100 mcg was administered
intravenously at the order of the provider performing the procedure.

Total intra-service moderate sedation time: 18  minutes.

Patient's level of consciousness and vital signs were monitored
continuously by radiology nurse throughout the procedure under the
supervision of the provider performing the procedure.

FLUOROSCOPY:
Radiation Exposure Index (as provided by the fluoroscopic device): 0
mGy Kerma

COMPLICATIONS:
None immediate.

PROCEDURE:
Informed written consent was obtained from the patient after a
thorough discussion of the procedural risks, benefits and
alternatives. All questions were addressed using an interpreter. A
timeout was performed prior to the initiation of the procedure.

Liver was evaluated with ultrasound. Patient was rolled onto his
left side. The right hepatic lobe was targeted for biopsy. Right
side of the abdomen was prepped with chlorhexidine and sterile field
was created. Skin and soft tissues were anesthetized with 1%
lidocaine. A small incision was made. Using ultrasound guidance, 17
gauge coaxial needle was directed into the right hepatic lobe. Total
of 3 core biopsies were obtained with an 18 gauge core device.
Specimens placed in formalin. Gel-Foam slurry was injected as the 17
gauge needle was removed. Bandage placed over the puncture site.
FINDINGS: Three adequate core biopsies obtained from the right hepatic lobe.
No immediate bleeding or hematoma formation.
IMPRESSION: Ultrasound-guided core biopsies from the right hepatic lobe.

## 2023-11-02 ENCOUNTER — Other Ambulatory Visit: Payer: Self-pay | Admitting: Family Medicine

## 2023-11-02 DIAGNOSIS — R7401 Elevation of levels of liver transaminase levels: Secondary | ICD-10-CM

## 2023-11-08 ENCOUNTER — Ambulatory Visit
Admission: RE | Admit: 2023-11-08 | Discharge: 2023-11-08 | Disposition: A | Discharge status: Home / Self Care | Payer: Managed Care, Other (non HMO) | Source: Ambulatory Visit | Attending: Family Medicine | Admitting: Family Medicine

## 2023-11-08 DIAGNOSIS — R7401 Elevation of levels of liver transaminase levels: Secondary | ICD-10-CM | POA: Diagnosis present

## 2024-08-03 ENCOUNTER — Other Ambulatory Visit: Payer: Self-pay

## 2024-08-03 ENCOUNTER — Other Ambulatory Visit (HOSPITAL_COMMUNITY): Payer: Self-pay

## 2024-08-03 ENCOUNTER — Telehealth (HOSPITAL_COMMUNITY): Payer: Self-pay | Admitting: Pharmacy Technician

## 2024-08-03 ENCOUNTER — Inpatient Hospital Stay
Admission: EM | Admit: 2024-08-03 | Discharge: 2024-08-05 | DRG: 638 | Disposition: A | Discharge status: Home / Self Care | Attending: Family Medicine | Admitting: Family Medicine

## 2024-08-03 DIAGNOSIS — B159 Hepatitis A without hepatic coma: Secondary | ICD-10-CM | POA: Diagnosis present

## 2024-08-03 DIAGNOSIS — E66811 Obesity, class 1: Secondary | ICD-10-CM | POA: Diagnosis present

## 2024-08-03 DIAGNOSIS — Z603 Acculturation difficulty: Secondary | ICD-10-CM | POA: Diagnosis present

## 2024-08-03 DIAGNOSIS — Z833 Family history of diabetes mellitus: Secondary | ICD-10-CM

## 2024-08-03 DIAGNOSIS — Z794 Long term (current) use of insulin: Secondary | ICD-10-CM

## 2024-08-03 DIAGNOSIS — Z6830 Body mass index (BMI) 30.0-30.9, adult: Secondary | ICD-10-CM

## 2024-08-03 DIAGNOSIS — E1100 Type 2 diabetes mellitus with hyperosmolarity without nonketotic hyperglycemic-hyperosmolar coma (NKHHC): Principal | ICD-10-CM | POA: Diagnosis present

## 2024-08-03 DIAGNOSIS — Z79899 Other long term (current) drug therapy: Secondary | ICD-10-CM

## 2024-08-03 DIAGNOSIS — E875 Hyperkalemia: Secondary | ICD-10-CM | POA: Diagnosis present

## 2024-08-03 DIAGNOSIS — Z87891 Personal history of nicotine dependence: Secondary | ICD-10-CM

## 2024-08-03 DIAGNOSIS — I10 Essential (primary) hypertension: Secondary | ICD-10-CM | POA: Diagnosis present

## 2024-08-03 HISTORY — DX: Hepatitis a without hepatic coma: B15.9

## 2024-08-03 HISTORY — DX: Prediabetes: R73.03

## 2024-08-03 LAB — CBC
HCT: 38.9 % — ABNORMAL LOW (ref 39.0–52.0)
Hemoglobin: 13.4 g/dL (ref 13.0–17.0)
MCH: 30.2 pg (ref 26.0–34.0)
MCHC: 34.4 g/dL (ref 30.0–36.0)
MCV: 87.8 fL (ref 80.0–100.0)
Platelets: 237 K/uL (ref 150–400)
RBC: 4.43 MIL/uL (ref 4.22–5.81)
RDW: 13.2 % (ref 11.5–15.5)
WBC: 5.2 K/uL (ref 4.0–10.5)
nRBC: 0 % (ref 0.0–0.2)

## 2024-08-03 LAB — URINALYSIS, ROUTINE W REFLEX MICROSCOPIC
Bacteria, UA: NONE SEEN
Bilirubin Urine: NEGATIVE
Glucose, UA: >=500 mg/dL — AB
Hgb urine dipstick: NEGATIVE
Ketones, ur: 5 mg/dL — AB
Leukocytes,Ua: NEGATIVE
Nitrite: NEGATIVE
Protein, ur: NEGATIVE mg/dL
RBC / HPF: 0 RBC/hpf (ref 0–5)
Specific Gravity, Urine: 1.027 (ref 1.005–1.030)
Squamous Epithelial / HPF: 0 /HPF (ref 0–5)
pH: 6 (ref 5.0–8.0)

## 2024-08-03 LAB — BASIC METABOLIC PANEL WITH GFR
Anion gap: 17 — ABNORMAL HIGH (ref 5–15)
Anion gap: 13 (ref 5–15)
Anion gap: 8 (ref 5–15)
BUN: 16 mg/dL (ref 6–20)
BUN: 15 mg/dL (ref 6–20)
BUN: 12 mg/dL (ref 6–20)
CO2: 21 mmol/L — ABNORMAL LOW (ref 22–32)
CO2: 24 mmol/L (ref 22–32)
CO2: 29 mmol/L (ref 22–32)
Calcium: 10 mg/dL (ref 8.9–10.3)
Calcium: 9.9 mg/dL (ref 8.9–10.3)
Calcium: 9.4 mg/dL (ref 8.9–10.3)
Chloride: 87 mmol/L — ABNORMAL LOW (ref 98–111)
Chloride: 100 mmol/L (ref 98–111)
Chloride: 104 mmol/L (ref 98–111)
Creatinine, Ser: 1.04 mg/dL (ref 0.61–1.24)
Creatinine, Ser: 0.81 mg/dL (ref 0.61–1.24)
Creatinine, Ser: 0.74 mg/dL (ref 0.61–1.24)
GFR, Estimated: >60 mL/min (ref >60)
GFR, Estimated: >60 mL/min (ref >60)
GFR, Estimated: >60 mL/min (ref >60)
Glucose, Bld: 912 mg/dL (ref 70–99)
Glucose, Bld: 392 mg/dL — ABNORMAL HIGH (ref 70–99)
Glucose, Bld: 186 mg/dL — ABNORMAL HIGH (ref 70–99)
Potassium: 5.2 mmol/L — ABNORMAL HIGH (ref 3.5–5.1)
Potassium: 3.9 mmol/L (ref 3.5–5.1)
Potassium: 3.4 mmol/L — ABNORMAL LOW (ref 3.5–5.1)
Sodium: 126 mmol/L — ABNORMAL LOW (ref 135–145)
Sodium: 137 mmol/L (ref 135–145)
Sodium: 142 mmol/L (ref 135–145)

## 2024-08-03 LAB — MRSA NEXT GEN BY PCR, NASAL: MRSA by PCR Next Gen: NOT DETECTED

## 2024-08-03 LAB — CBG MONITORING, ED
Glucose-Capillary: >600 mg/dL (ref 70–99)
Glucose-Capillary: 573 mg/dL (ref 70–99)
Glucose-Capillary: 382 mg/dL — ABNORMAL HIGH (ref 70–99)
Glucose-Capillary: 431 mg/dL — ABNORMAL HIGH (ref 70–99)
Glucose-Capillary: 281 mg/dL — ABNORMAL HIGH (ref 70–99)
Glucose-Capillary: 230 mg/dL — ABNORMAL HIGH (ref 70–99)

## 2024-08-03 LAB — BLOOD GAS, VENOUS
Acid-Base Excess: 1.2 mmol/L (ref 0.0–2.0)
Bicarbonate: 26 mmol/L (ref 20.0–28.0)
Patient temperature: 37
pCO2, Ven: 41 mmHg — ABNORMAL LOW (ref 44–60)
pH, Ven: 7.41 (ref 7.25–7.43)
pO2, Ven: 68 mmHg — ABNORMAL HIGH (ref 32–45)

## 2024-08-03 LAB — GLUCOSE, CAPILLARY
Glucose-Capillary: 164 mg/dL — ABNORMAL HIGH (ref 70–99)
Glucose-Capillary: 172 mg/dL — ABNORMAL HIGH (ref 70–99)
Glucose-Capillary: 214 mg/dL — ABNORMAL HIGH (ref 70–99)
Glucose-Capillary: 217 mg/dL — ABNORMAL HIGH (ref 70–99)
Glucose-Capillary: 229 mg/dL — ABNORMAL HIGH (ref 70–99)

## 2024-08-03 LAB — BETA-HYDROXYBUTYRIC ACID: Beta-Hydroxybutyric Acid: 1.2 mmol/L — ABNORMAL HIGH (ref 0.05–0.27)

## 2024-08-03 LAB — OSMOLALITY: Osmolality: 328 mosm/kg (ref 275–295)

## 2024-08-03 MED ORDER — ALBUTEROL SULFATE (2.5 MG/3ML) 0.083% IN NEBU: 2.5000 mg | INHALATION_SOLUTION | RESPIRATORY_TRACT | Status: DC | PRN

## 2024-08-03 MED ORDER — CHLORHEXIDINE GLUCONATE CLOTH 2 % EX PADS: 6.0000 | MEDICATED_PAD | Freq: Every day | CUTANEOUS | Status: DC

## 2024-08-03 MED ORDER — INSULIN REGULAR(HUMAN) IN NACL 100-0.9 UT/100ML-% IV SOLN
INTRAVENOUS | Status: DC
  Administered 2024-08-03: 18 [IU]/h via INTRAVENOUS
  Filled 2024-08-03 (×2): qty 100

## 2024-08-03 MED ORDER — IBUPROFEN 400 MG PO TABS: 400.0000 mg | ORAL_TABLET | Freq: Four times a day (QID) | ORAL | Status: DC | PRN

## 2024-08-03 MED ORDER — SODIUM CHLORIDE 0.9 % IV BOLUS
500.0000 mL | Freq: Once | INTRAVENOUS | Status: AC
  Administered 2024-08-03: 500 mL via INTRAVENOUS

## 2024-08-03 MED ORDER — LOSARTAN POTASSIUM-HCTZ 100-12.5 MG PO TABS: 1.0000 | ORAL_TABLET | Freq: Every day | ORAL | Status: DC

## 2024-08-03 MED ORDER — AMLODIPINE BESYLATE 5 MG PO TABS
5.0000 mg | ORAL_TABLET | Freq: Every day | ORAL | Status: DC
  Administered 2024-08-04 – 2024-08-05 (×2): 5 mg via ORAL
  Filled 2024-08-03 (×2): qty 1

## 2024-08-03 MED ORDER — LOSARTAN POTASSIUM 50 MG PO TABS
100.0000 mg | ORAL_TABLET | Freq: Every day | ORAL | Status: DC
  Administered 2024-08-04 – 2024-08-05 (×2): 100 mg via ORAL
  Filled 2024-08-03 (×2): qty 2

## 2024-08-03 MED ORDER — ONDANSETRON HCL 4 MG/2ML IJ SOLN: 4.0000 mg | Freq: Four times a day (QID) | INTRAMUSCULAR | Status: DC | PRN

## 2024-08-03 MED ORDER — INSULIN ASPART 100 UNIT/ML IJ SOLN
0.0000 [IU] | INTRAMUSCULAR | Status: DC
  Administered 2024-08-03: 4 [IU] via SUBCUTANEOUS
  Administered 2024-08-03: 7 [IU] via SUBCUTANEOUS
  Administered 2024-08-04: 4 [IU] via SUBCUTANEOUS
  Administered 2024-08-04: 3 [IU] via SUBCUTANEOUS
  Administered 2024-08-04 (×3): 11 [IU] via SUBCUTANEOUS
  Administered 2024-08-05: 4 [IU] via SUBCUTANEOUS
  Administered 2024-08-05: 15 [IU] via SUBCUTANEOUS
  Administered 2024-08-05: 3 [IU] via SUBCUTANEOUS
  Filled 2024-08-03: qty 3
  Filled 2024-08-03: qty 15
  Filled 2024-08-03: qty 4
  Filled 2024-08-03: qty 3
  Filled 2024-08-03: qty 11
  Filled 2024-08-03: qty 7
  Filled 2024-08-03 (×2): qty 4
  Filled 2024-08-03 (×2): qty 11

## 2024-08-03 MED ORDER — ZOLPIDEM TARTRATE 5 MG PO TABS: 5.0000 mg | ORAL_TABLET | Freq: Every evening | ORAL | Status: DC | PRN

## 2024-08-03 MED ORDER — LACTATED RINGERS IV BOLUS
20.0000 mL/kg | Freq: Once | INTRAVENOUS | Status: AC
  Administered 2024-08-03: 1724 mL via INTRAVENOUS

## 2024-08-03 MED ORDER — DEXTROSE IN LACTATED RINGERS 5 % IV SOLN: INTRAVENOUS | Status: DC

## 2024-08-03 MED ORDER — LACTATED RINGERS IV SOLN: INTRAVENOUS | Status: DC

## 2024-08-03 MED ORDER — INSULIN GLARGINE-YFGN 100 UNIT/ML ~~LOC~~ SOLN
5.0000 [IU] | Freq: Once | SUBCUTANEOUS | Status: AC
  Administered 2024-08-03: 5 [IU] via SUBCUTANEOUS
  Filled 2024-08-03: qty 0.05

## 2024-08-03 MED ORDER — ONDANSETRON HCL 4 MG PO TABS: 4.0000 mg | ORAL_TABLET | Freq: Four times a day (QID) | ORAL | Status: DC | PRN

## 2024-08-03 MED ORDER — SODIUM CHLORIDE 0.9 % IV SOLN: INTRAVENOUS | Status: DC

## 2024-08-03 MED ORDER — HYDROCHLOROTHIAZIDE 12.5 MG PO TABS
12.5000 mg | ORAL_TABLET | Freq: Every day | ORAL | Status: DC
  Administered 2024-08-04 – 2024-08-05 (×2): 12.5 mg via ORAL
  Filled 2024-08-03 (×2): qty 1

## 2024-08-03 MED ORDER — DEXTROSE 50 % IV SOLN: 0.0000 mL | INTRAVENOUS | Status: DC | PRN

## 2024-08-03 MED ORDER — ENOXAPARIN SODIUM 40 MG/0.4ML IJ SOSY
40.0000 mg | PREFILLED_SYRINGE | INTRAMUSCULAR | Status: DC
  Administered 2024-08-03 – 2024-08-04 (×2): 40 mg via SUBCUTANEOUS
  Filled 2024-08-03 (×2): qty 0.4

## 2024-08-03 NOTE — H&P (Signed)
 History and Physical  Marvin Brewer FMW:[id number] DOB: [birth year] DOA: 08/03/2024  PCP: System, Provider Not In   Chief Complaint: Feeling weak  HPI: Marvin Brewer is a 48 y.o. male with medical history significant for hypertension, prediabetes, prior history of hepatitis A, recently evaluated by [facility] gastroenterology for hepatitis of unclear etiology and placed on 2-week course of empiric prednisone who presents with several days of worsening lethargy, thirst, polyuria and subjective weight loss found to have significantly elevated hyperglycemia due to HHS.  He took 2 weeks of prednisone 40 mg p.o. daily and felt well while taking this medication.  Plan is for repeat labs and follow-up with [facility] gastroenterology, which she has not done yet.  He denies any fevers, chills, nausea, vomiting, dysuria, cough, shortness of breath.  He does have a family history of diabetes in his mother, was told in the past that he is prediabetic but has never taken any medications for this.  Review of Systems: Please see HPI for pertinent positives and negatives. A complete 10 system review of systems are otherwise negative.  Past Medical History:  Diagnosis Date   Hepatitis A    10/25   Hypertension    Prediabetes    Past Surgical History:  Procedure Laterality Date   NO PAST SURGERIES     Social History:  reports that he has quit smoking. His smoking use included cigarettes. He has never used smokeless tobacco. He reports that he does not drink alcohol and does not use drugs.  No Known Allergies  History reviewed. No pertinent family history.   Prior to Admission medications   Medication Sig Start Date End Date Taking? Authorizing Provider  amLODipine (NORVASC) 5 MG tablet Take 5 mg by mouth daily. 04/12/24  Yes [provider]  hydrocortisone 2.5 % cream    Yes [provider]  losartan-hydrochlorothiazide (HYZAAR) 100-12.5 MG tablet Take 1 tablet by mouth  daily. 04/12/24  Yes [provider]  famotidine (PEPCID) 20 MG tablet famotidine 20 mg tablet  TAKE 1 TABLET BY MOUTH TWICE DAILY Patient not taking: Reported on 08/03/2024    [provider]  lisinopril (ZESTRIL) 10 MG tablet Take 10 mg by mouth daily. Patient not taking: Reported on 08/03/2024 04/14/21   [provider]  milk thistle 175 MG tablet Take 175 mg by mouth daily.    [provider]    Physical Exam: BP (!) 120/90 (BP Location: Left Arm)   Pulse 90   Temp 98.3 F (36.8 C) (Oral)   Resp 20   Ht 5' 6 (1.676 m)   Wt 86.2 kg   SpO2 98%   BMI 30.67 kg/m  General:  Alert, oriented, calm, in no acute distress, looks comfortable, seen in conjunction with inpatient diabetes coordinator Cardiovascular: RRR, no murmurs or rubs, no peripheral edema  Respiratory: clear to auscultation bilaterally, no wheezes, no crackles  Abdomen: soft, nontender, nondistended, normal bowel tones heard  Skin: dry, no rashes  Musculoskeletal: no joint effusions, normal range of motion  Psychiatric: appropriate affect, normal speech  Neurologic: extraocular muscles intact, clear speech, moving all extremities with intact sensorium         Labs on Admission:  Basic Metabolic Panel: Recent Labs  Lab 08/03/24 1115  NA 126*  K 5.2*  CL 87*  CO2 21*  GLUCOSE 912*  BUN 16  CREATININE 1.04  CALCIUM 10.0   Liver Function Tests: No results for input(s): AST, ALT, ALKPHOS, BILITOT, PROT, ALBUMIN in the last 168  hours. No results for input(s): LIPASE, AMYLASE in the last 168 hours. No results for input(s): AMMONIA in the last 168 hours. CBC: Recent Labs  Lab 08/03/24 1115  WBC 5.2  HGB 13.4  HCT 38.9*  MCV 87.8  PLT 237   Cardiac Enzymes: No results for input(s): CKTOTAL, CKMB, CKMBINDEX, TROPONINI in the last 168 hours. BNP (last 3 results) No results for input(s): BNP in the last 8760 hours.  ProBNP (last 3 results) No  results for input(s): PROBNP in the last 8760 hours.  CBG: Recent Labs  Lab 08/03/24 1115 08/03/24 1415 08/03/24 1447  GLUCAP >600* 573* 431*    Radiological Exams on Admission: No results found.  Assessment/Plan  Marvin Brewer is a 48 y.o. male with medical history significant for hypertension, prediabetes, prior history of hepatitis A, recently evaluated by [facility] gastroenterology for hepatitis of unclear etiology and placed on 2-week course of empiric prednisone who presents with several days of worsening lethargy, thirst, polyuria and subjective weight loss found to have significantly elevated hyperglycemia due to HHS.  Hyperosmolar hyperglycemic state-likely due to new onset type 2 diabetes, instigated by recent high-dose steroids -Inpatient admission -Monitor closely on stepdown unit -IV insulin drip and IV fluids per HHS protocol -Follow BMP every 6 hours -N.p.o. with sips and clears -Check A1c (no prior value found in our records or that of [facility]) -Inpatient diabetes coordinator consult  Pseudohyponatremia-due to hyperglycemia, monitor with every 6 hour BMPs  Hyperkalemia-mild and should improve with blood sugar correction, will monitor closely with every 6 hour BMPs  Hypertension-continue home amlodipine and Hyzaar  DVT prophylaxis: Lovenox     Code Status: Full Code  Consults called: None  Admission status: The appropriate patient status for this patient is INPATIENT. Inpatient status is judged to be reasonable and necessary in order to provide the required intensity of service to ensure the patient's safety. The patient's presenting symptoms, physical exam findings, and initial radiographic and laboratory data in the context of their chronic comorbidities is felt to place them at high risk for further clinical deterioration. Furthermore, it is not anticipated that the patient will be medically stable for discharge from the hospital within 2 midnights of  admission.    I certify that at the point of admission it is my clinical judgment that the patient will require inpatient hospital care spanning beyond 2 midnights from the point of admission due to high intensity of service, high risk for further deterioration and high frequency of surveillance required  Time spent: 65 minutes  Bernell Sigal CHRISTELLA Gail MD [facility] Pager [phone]  If 7PM-7AM, please contact night-coverage [email or web address] Password [facility]  08/03/2024, 2:53 PM

## 2024-08-03 NOTE — ED Triage Notes (Signed)
 Pt to ED for medication reaction. Pt was placed on prednisone about 2 months ago at [facility] because had hepatitis A. States the prednisone made his mouth taste sour, mouth and throat dry, needs to drink water all the time and is urinating a lot when he drinks so much water, and has not been sleeping well since the prednisone. Has been off the prednisone for 1 month and continues to have the same symptoms. States this morning his legs felt weak and he felt dizzy when he stood up from lying flat. Pt is Spanish speaking.

## 2024-08-03 NOTE — Discharge Instructions (Addendum)
 Contar carbohidratos y la diabetes  Por qu es importante el conteo de carbohidratos?  ? Contar las porciones de carbohidratos ayuda a sales executive nivel de glucosa (azcar) en su sangre para que se sienta mejor.  ? El equilibrio the kroger carbohidratos que come y dietitian determina el nivel de glucosa que tendr en la sangre despus de comer.  ? Contar carbohidratos tambin le ayudar a planificar sus comidas.   Qu alimentos contienen carbohidratos?  Entre los alimentos con carbohidratos se incluyen:  ? Panes, galletas saladas y cereales  ? Pastas, arroz y granos  ? Vegetales (verduras) con almidn, como papas, elote (maz o choclo) y chcharos (guisantes o arvejas)  ? Frijoles (habichuelas) y legumbres  ? Leche, leche de soya y yogur  ? Joretta y jugos de fruta  ? Dulces como pasteles, galletas, helados, mermeladas y jaleas   Porciones de carbohidratos  Al planificar comidas para la diabetes, recuerde que un alimento con 1 porcin de carbohidratos contiene aproximadamente 15 gramos de carbohidratos:  ? Revise el tamao de las porciones con tazas y cucharas de medir o con una pesa de alimentos.  ? Lea los Datos de Nutricin en las etiquetas de los alimentos para saber cuntos gramos de carbohidratos contienen los alimentos que come.   Los eaton corporation de este folleto muestran porciones que contienen cerca de 15 gramos de carbohidratos. Copyright Academy of Nutrition and Dietetics. This handout may be duplicated for client education. Carbohydrate Counting for Diabetes (Spanish) - 2   Consejos para planificar sus comidas  ? Un Plan de Alimentacin indica cuntas porciones de carbohidratos consumir en sus comidas y refrigerios (snacks). Para muchos adultos es adecuado comer 3 a 5 porciones de carbohidratos en cada comida y de 1 a 2 porciones de carbohidratos, en cada refrigerio.  ? En un Plan de Alimentacin diaria saludable, la mayora de los carbohidratos provienen de:  o  Al menos 6 porciones de frutas y vegetales sin almidn  o Al menos 6 porciones de forensic scientist, frijoles y sports administrator con almidn, con al menos 3 de estas porciones de granos integrales (enteros)  o Al menos 2 porciones de teaching laboratory technician o productos lcteos  ? Revise regularmente su nivel de glucosa en la sangre. Esto puede indicarle si necesita ajustar las horas a las que consume carbohidratos.  ? Comer alimentos que contienen [place], como granos [place], y comer muy pocos alimentos salados es bueno para su salud.  ? Coma 4 a 6 onzas de carne u otros alimentos con protenas (como hamburguesas de soya) cada da. Elija fuentes de protena bajas en grasa, como carne de res y de cerdo bajas en grasa, pollo, pescado, queso bajo en grasa o alimentos vegetarianos como la soya.  ? Coma algunas grasas saludables, como aceite de [place], de canola y nueces.  ? Coma muy pocas grasas saturadas. Estas grasas no son saludables y se merchandiser, retail, la crema y las carnes con mucha grasa, como el tocino (tocineta) y las salchichas o advertising copywriter.  ? Coma muy pocas o nada de grasas trans. Estas grasas no son saludables y se encuentran en todos los alimentos que contienen aceites "parcialmente hidrogenados" en su lista de ingredientes.   Consejos para leer etiquetas  En los Datos de Nutricin de las etiquetas aparece una lista con el total de gramos de carbohidratos en una porcin estndar. La porcin estndar puede ser mayor o menor que 1 porcin de carbohidratos. Para saber cuntas porciones de carbohidratos hay  en un alimento:  ? Primero mire el tamao de la porcin estndar de la etiqueta.  ? Luego verifique el total de gramos de carbohidratos. Esta es la cantidad de carbohidratos en 1 porcin estndar. Divida el total de gramos de carbohidratos por 15. Este nmero equivale al nmero de porciones de carbohidratos en 1 porcin estndar. Recuerde: 1 porcin de carbohidratos equivale a 15 gramos de carbohidratos.  ? Nota:  Puede ignorar los gramos de azcar en los Datos de Nutricin, ya que estn incluidos en el total de gramos de carbohidratos.  Copyright Academy of Nutrition and Dietetics. This handout may be duplicated for client education. Carbohydrate Counting for Diabetes (Spanish) - 3   Listas de alimentos para el conteo de carbohidratos  1 porcin = cerca de 15 gramos de carbohidratos  Almidones  ? 1 rebanada de pan (1 onza)  ? 1 tortilla (6 pulgadas)  ?  rosca de pan (bagel) grande (1 onza)  ? 2 tortillas para taco (5 pulgadas)  ?  pan para hamburguesa o para salchicha (hot dog) (3/4 onza)  ?  taza de cereal listo para comer sin endulzar  ?  taza de cereal cocido  ? 1 taza de sopa a base de caldo  ? 4-6 galletitas saladas  ? ? taza de pasta o arroz (cocidos)  ?  taza de frijoles, chcharos, granos de elote, camotes (batatas, boniatos), calabaza (zapallo), pur de papas o papas hervidas (cocidos)  ?  papa grande asada (3 onzas)  ?  onza de pretzels, papitas o totopos (tortilla chips)  ? 3 tazas de palomitas de maz (popcorn) (ya preparadas)   Joretta  ? 1 fruta fresca pequea ( a 1 taza)  ?  taza de fruta enlatada o congelada  ? 17 uvas pequeas (3 onzas)  ? 1 taza de meln, bayas (moras)  ?  vaso de jugo de fruta  ? 2 cucharadas de frutas secas (arndanos azules/blueberries, cerezas, arndanos rojos/cranberries, frutas surtidas, uvas pasas/pasitas)  Leche  ? 1 taza de ppg industries o reducida en grasa  ? 1 taza de leche de soya  ? ? taza de yogur descremado endulzado con un edulcorante sin azcar (6 onzas)   Dulces y postres  ? pastel cuadrado de 2 pulgadas (sin betn/cobertura)  ? 2 galletitas dulces (? onzas)  ?  taza de helado o yogur congelado  ?  taza de sorbete (sherbet) o nieve (sorbet)  ? 1 cucharada de jarabe (sirope), mermelada, jalea, azcar o miel  ? 2 cucharadas de jarabe bajo en caloras  Copyright Academy of Nutrition and Dietetics. This handout may be  duplicated for client education. Carbohydrate Counting for Diabetes (Spanish) - 4   Otros alimentos  ? Cuente 1 taza de vegetales crudos o  taza de vegetales sin almidn, cocidas, como porciones de alimentos con cero (0) carbohidratos o "sin restriccin." Si come 3 o ms porciones en una comida, cuntelas como 1 porcin de carbohidratos.  ? Los alimentos que contienen menos de 20 caloras en cada porcin tambin pueden contarse como porciones con cero carbohidratos o alimentos "sin restriccin."  ? Cuente 1 taza de guiso (estofado) u otros alimentos combinados como 2 porciones de carbohidratos.   Notas: Copyright Academy of Nutrition and Dietetics. This handout may be duplicated for client education. Carbohydrate Counting for Diabetes (Spanish) - 5   Contar carbohidratos y la diabetes: Ejemplo de men para 1 da [place]ay[place]  1 pltano/banana pequeo (1 carbohidrato)   taza de hojuelas de maz (cornflakes) (1  carbohidrato)  1 taza de leche descremada o baja en grasa (1 carbohidrato)  1 rebanada de pan de trigo integral (1 carbohidrato)  1 cucharadita de margarina   Almuerzo  2 onzas de rebanadas de [place]  2 rebanadas de pan de trigo integral (2 carbohidratos)  2 hojas de lechuga  4 palitos de apio  4 palitos de zanahoria  1 environmental health practitioner (1 carbohidrato)  1 taza de leche descremada o baja en grasa (1 carbohidrato)   Refrigerio  2 cucharadas de uvas pasas/pasitas (1 carbohidrato)   onzas de mini pretzels sin sal (1 carbohidrato)   Cena  3 onzas de carne asada de res, magra   papa grande asada (2 carbohidratos)  1 cucharada de crema agria reducida en grasa   taza de ejotes/habichuelas verdes/chauchas  1 taza de ensalada de vegetales  1 cucharada de aderezo para ensaladas reducido en caloras  1 panecillo de trigo integral (1 carbohidrato)  1 cucharadita de margarina  1 taza de bolitas de meln (1 carbohidrato)   Refrigerio  6 onzas de yogur de frutas bajo en grasa, sin azcar (1  carbohidrato)  2 cucharadas de nueces sin sal    [facility] [place]  [facility] [place] es un centro de salud sin fines de lucro que ofrece atencin integral gratuita a residentes sin seguro mdico y de bajos recursos del Condado de Film/video Editor. La clnica ofrece atencin para enfermedades agudas y crnicas con dignidad, equidad, profesionalismo y compasin. [facility] [place] de [place] +2 Directorio de Clnicas Gratuito +2  [place]ireccin: [address], [place], [place] [address] Directorio de Clnicas Gratuito +1  [place]: [phone] Directorio de Clnicas Gratuito  Horario:  Martes: 9:00 a. m. a 4:00 p. m. [facility]  [place]: 9:00 a. m. a 4:00 p. m. (solo citas virtuales) [facility] [place]: 9:00 a. m. a 8:00 p. m. [facility] [place]  [place]: [place] [facility] [place] de Film/video Editor

## 2024-08-03 NOTE — TOC Initial Note (Signed)
 Transition of Care [facility]) - Initial/Assessment Note    Patient Details  Name: Marvin Brewer MRN: [id number] Date of Birth: 1975/12/31  [facility] CM/SW Contact:    Kayli Beal L Junelle Hashemi, LCSW Phone Number: 08/03/2024, 4:06 PM  Clinical Narrative:                  [facility] consult received for PCP needs. Resources added to the AVS for patient.        Patient Goals and CMS Choice            Expected Discharge Plan and Services                                              Prior Living Arrangements/Services                       Activities of Daily Living      Permission Sought/Granted                  Emotional Assessment              Admission diagnosis:  Type 2 diabetes mellitus with hyperosmolar hyperglycemic state (HHS) (HCC) [E11.00] Patient Active Problem List   Diagnosis Date Noted   Type 2 diabetes mellitus with hyperosmolar hyperglycemic state (HHS) (HCC) 08/03/2024   Labile hypertension 05/05/2021   Elevated liver enzymes 02/08/2020   Benign essential hypertension 02/08/2020   Hypercholesterolemia 02/08/2020   Prediabetes 02/08/2020   PCP:  System, Provider Not In Pharmacy:   [id number] [place], [place] - [address] AT [facility] [address] [place] [address] [place] [address] Phone: [phone] Fax: [phone]     Social Drivers of Health (SDOH) Social History: SDOH Screenings   Housing: Unknown (06/01/2024)   Received from [facility]  Tobacco Use: Medium Risk (08/03/2024)   SDOH Interventions:     Readmission Risk Interventions     No data to display

## 2024-08-03 NOTE — Inpatient Diabetes Management (Addendum)
 Inpatient Diabetes Program Recommendations  AACE/ADA: New Consensus Statement on Inpatient Glycemic Control (2015)  Target Ranges:  Prepandial:   less than 140 mg/dL      Peak postprandial:   less than 180 mg/dL (1-2 hours)      Critically ill patients:  140 - 180 mg/dL    Latest Reference Range & Units 08/03/24 11:15  Sodium 135 - 145 mmol/L 126 (L)  Potassium 3.5 - 5.1 mmol/L 5.2 (H)  Chloride 98 - 111 mmol/L 87 (L)  CO2 22 - 32 mmol/L 21 (L)  Glucose 70 - 99 mg/dL 087 (HH)  BUN 6 - 20 mg/dL 16  Creatinine 9.38 - 8.75 mg/dL 8.95  Calcium 8.9 - 89.6 mg/dL 89.9  Anion gap 5 - 15  17 (H)  GFR, Estimated >60 mL/min >60     To ED with HHNK/ New Diagnosis Diabetes (Lab Glu 912/ AG 17/ CO2 21/ BHB 1.20)  Took Prednisone 40 mg daily for 1 mos (looks like October) for Hepatitis A  IVF ordered  Hospitalist consulted for admission/ IV Insulin Drip   Addendum 2:30pm--Met w/ pt down in the ED.  Pt A&O and able to hold meaningful conversation.  Used Ipad interpreter Real [phone].  Pt reports several days of worsening lethargy, thirst, polyuria and subjective weight loss.  Took Prednisone for 2 weeks--Symptoms started after steroids started.  Was told he had pre-diabetes.  Mother has diabetes and takes insulin.  Discussed with patient hyperglycemia, treatment of high sugar, lab results, and transition plan to SQ insulin regimen.  Spoke with pt about new diagnosis.  Explained basic pathophysiology of DM Type 2, basic home care, basic diabetes diet nutrition principles, importance of checking CBGs and maintaining good CBG control to prevent long-term and short-term complications.  Reviewed signs and symptoms of hyperglycemia and hypoglycemia and how to treat hypoglycemia at home.  Also reviewed blood sugar goals and A1c goals for home.  Also discussed DM diet information with patient.  Encouraged patient to avoid beverages with sugar (regular soda, sweet tea, lemonade, fruit juice) and to consume  mostly water.  Discussed what foods contain carbohydrates and how carbohydrates affect the body's blood sugar levels.  Encouraged patient to be careful with his portion sizes (especially grains, starchy vegetables, and fruits).    RNs to provide ongoing basic DM education at bedside with this patient.  Will order educational booklet, insulin starter kit once pt has room assignment.  Have also placed RD consult for DM diet education for this patient.  It appears pt has Insurance but no PCP listed.  Will consult TOC team.  Benefits check started for long and quick acting insulins.    Educated patient on insulin pen use at home.  Reviewed all steps of insulin pen including attachment of needle, 2-unit air shot, dialing up dose, giving injection, rotation of injection sites, removing needle, disposal of sharps, storage of unused insulin, disposal of insulin etc.  Patient able to provide successful return demonstration.  Reviewed troubleshooting with insulin pen.        --Will follow patient during hospitalization--  Adina Rudolpho Arrow RN, MSN, CDCES Diabetes Coordinator Inpatient Glycemic Control Team Team Pager: [phone] (8a-5p)

## 2024-08-03 NOTE — Telephone Encounter (Signed)
 Patient Product/process Development Scientist completed.    The patient is insured through [employer]. Patient has [employer], may use a copay card, and/or apply for patient assistance if available.    Ran test claim for Basaglar Pen and the current 30 day co-pay is $25.00.  Ran test claim for Humalog KwikPen and the current 30 day co-pay is $25.00.  This test claim was processed through [facility]- copay amounts may vary at other pharmacies due to pharmacy/plan contracts, or as the patient moves through the different stages of their insurance plan.     Reyes Sharps, CPHT Pharmacy Technician Patient Advocate Specialist Lead [facility] Patient Advocate Team Direct Number: [phone]  Fax: [phone]

## 2024-08-03 NOTE — ED Provider Notes (Signed)
 [facility] Provider Note    Event Date/Time   First MD Initiated Contact with Patient 08/03/24 1143     (approximate)   History   Medication Reaction, Polydipsia, and Polyuria  Spanish interpreter used HPI  Marvin Brewer is a 48 y.o. male who presents with feeling dehydrated and frequent urination.  He reports he was put on prednisone for treatment for hepatitis A, took it for approximately 3 to 4 weeks but cannot take the side effects and so he had to stop it.  Reported medical history of prediabetes     Physical Exam   Triage Vital Signs: ED Triage Vitals  Encounter Vitals Group     BP 08/03/24 1107 (!) 138/94     Girls Systolic BP Percentile --      Girls Diastolic BP Percentile --      Boys Systolic BP Percentile --      Boys Diastolic BP Percentile --      Pulse Rate 08/03/24 1107 (!) 112     Resp 08/03/24 1107 20     Temp 08/03/24 1107 97.6 F (36.4 C)     Temp Source 08/03/24 1107 Oral     SpO2 08/03/24 1107 99 %     Weight 08/03/24 1113 86.2 kg (190 lb)     Height 08/03/24 1113 1.676 m (5' 6)     Head Circumference --      Peak Flow --      Pain Score 08/03/24 1111 0     Pain Loc --      Pain Education --      Exclude from Growth Chart --     Most recent vital signs: Vitals:   08/03/24 1107  BP: (!) 138/94  Pulse: (!) 112  Resp: 20  Temp: 97.6 F (36.4 C)  SpO2: 99%     General: Awake, no distress.  CV:  Good peripheral perfusion.  Tachycardia Resp:  Normal effort.  Abd:  No distention.  Other:  Dry mucous membranes   ED Results / Procedures / Treatments   Labs (all labs ordered are listed, but only abnormal results are displayed) Labs Reviewed  CBC - Abnormal; Notable for the following components:      Result Value   HCT 38.9 (*)    All other components within normal limits  URINALYSIS, ROUTINE W REFLEX MICROSCOPIC - Abnormal; Notable for the following components:   Color, Urine COLORLESS (*)     APPearance CLEAR (*)    Glucose, UA >=500 (*)    Ketones, ur 5 (*)    All other components within normal limits  BASIC METABOLIC PANEL WITH GFR - Abnormal; Notable for the following components:   Sodium 126 (*)    Potassium 5.2 (*)    Chloride 87 (*)    CO2 21 (*)    Glucose, Bld 912 (*)    Anion gap 17 (*)    All other components within normal limits  BETA-HYDROXYBUTYRIC ACID - Abnormal; Notable for the following components:   Beta-Hydroxybutyric Acid 1.20 (*)    All other components within normal limits  BLOOD GAS, VENOUS - Abnormal; Notable for the following components:   pCO2, Ven 41 (*)    pO2, Ven 68 (*)    All other components within normal limits  CBG MONITORING, ED - Abnormal; Notable for the following components:   Glucose-Capillary >600 (*)    All other components within normal limits  OSMOLALITY  CBG MONITORING, ED  EKG     RADIOLOGY     PROCEDURES:  Critical Care performed: yes  CRITICAL CARE Performed by: Lamar Price   Total critical care time: 30 minutes  Critical care time was exclusive of separately billable procedures and treating other patients.  Critical care was necessary to treat or prevent imminent or life-threatening deterioration.  Critical care was time spent personally by me on the following activities: development of treatment plan with patient and/or surrogate as well as nursing, discussions with consultants, evaluation of patient's response to treatment, examination of patient, obtaining history from patient or surrogate, ordering and performing treatments and interventions, ordering and review of laboratory studies, ordering and review of radiographic studies, pulse oximetry and re-evaluation of patient's condition.   Procedures   MEDICATIONS ORDERED IN ED: Medications  lactated ringers  bolus 1,724 mL (has no administration in time range)  insulin  regular, human (MYXREDLIN ) 100 units/ 100 mL infusion (has no  administration in time range)  lactated ringers  infusion (has no administration in time range)  dextrose  5 % in lactated ringers  infusion (has no administration in time range)  dextrose  50 % solution 0-50 mL (has no administration in time range)  sodium chloride  0.9 % bolus 500 mL (500 mLs Intravenous New Bag/Given 08/03/24 1120)     IMPRESSION / MDM / ASSESSMENT AND PLAN / ED COURSE  I reviewed the triage vital signs and the nursing notes. Patient's presentation is most consistent with acute presentation with potential threat to life or bodily function.  Patient presents with dehydration status post taking prednisone as well as polyuria with tachycardia  Highly suspicious for hyperglycemia, DKA  Glucometer was reading greater than 600 consistent with hyperglycemia  Labs pending, will start IV fluids  BMP demonstrates glucose of 912 but CO2 of 21, pH is 7.41 most consistent with HHS  Insulin  drip ordered  Will consult the hospitalist team for admission        FINAL CLINICAL IMPRESSION(S) / ED DIAGNOSES   Final diagnoses:  Hyperosmolar hyperglycemic state (HHS) (HCC)     Rx / DC Orders   ED Discharge Orders     None        Note:  This document was prepared using Dragon voice recognition software and may include unintentional dictation errors.   Price Lamar, MD 08/03/24 1320

## 2024-08-04 DIAGNOSIS — E1100 Type 2 diabetes mellitus with hyperosmolarity without nonketotic hyperglycemic-hyperosmolar coma (NKHHC): Secondary | ICD-10-CM | POA: Diagnosis not present

## 2024-08-04 LAB — BASIC METABOLIC PANEL WITH GFR
Anion gap: 9 (ref 5–15)
Anion gap: 8 (ref 5–15)
BUN: 13 mg/dL (ref 6–20)
BUN: 13 mg/dL (ref 6–20)
CO2: 28 mmol/L (ref 22–32)
CO2: 27 mmol/L (ref 22–32)
Calcium: 9.1 mg/dL (ref 8.9–10.3)
Calcium: 8.9 mg/dL (ref 8.9–10.3)
Chloride: 106 mmol/L (ref 98–111)
Chloride: 107 mmol/L (ref 98–111)
Creatinine, Ser: 0.79 mg/dL (ref 0.61–1.24)
Creatinine, Ser: 0.73 mg/dL (ref 0.61–1.24)
GFR, Estimated: >60 mL/min (ref >60)
GFR, Estimated: >60 mL/min (ref >60)
Glucose, Bld: 154 mg/dL — ABNORMAL HIGH (ref 70–99)
Glucose, Bld: 148 mg/dL — ABNORMAL HIGH (ref 70–99)
Potassium: 3.2 mmol/L — ABNORMAL LOW (ref 3.5–5.1)
Potassium: 3.3 mmol/L — ABNORMAL LOW (ref 3.5–5.1)
Sodium: 143 mmol/L (ref 135–145)
Sodium: 143 mmol/L (ref 135–145)

## 2024-08-04 LAB — HEMOGLOBIN A1C
Hgb A1c MFr Bld: 12.8 % — ABNORMAL HIGH (ref 4.8–5.6)
Mean Plasma Glucose: 321 mg/dL

## 2024-08-04 LAB — CBC
HCT: 36 % — ABNORMAL LOW (ref 39.0–52.0)
Hemoglobin: 12.6 g/dL — ABNORMAL LOW (ref 13.0–17.0)
MCH: 30.1 pg (ref 26.0–34.0)
MCHC: 35 g/dL (ref 30.0–36.0)
MCV: 86.1 fL (ref 80.0–100.0)
Platelets: 195 K/uL (ref 150–400)
RBC: 4.18 MIL/uL — ABNORMAL LOW (ref 4.22–5.81)
RDW: 13.3 % (ref 11.5–15.5)
WBC: 6 K/uL (ref 4.0–10.5)
nRBC: 0 % (ref 0.0–0.2)

## 2024-08-04 LAB — GLUCOSE, CAPILLARY
Glucose-Capillary: 147 mg/dL — ABNORMAL HIGH (ref 70–99)
Glucose-Capillary: 147 mg/dL — ABNORMAL HIGH (ref 70–99)
Glucose-Capillary: 150 mg/dL — ABNORMAL HIGH (ref 70–99)
Glucose-Capillary: 281 mg/dL — ABNORMAL HIGH (ref 70–99)
Glucose-Capillary: 287 mg/dL — ABNORMAL HIGH (ref 70–99)
Glucose-Capillary: 291 mg/dL — ABNORMAL HIGH (ref 70–99)

## 2024-08-04 LAB — HIV ANTIBODY (ROUTINE TESTING W REFLEX): HIV Screen 4th Generation wRfx: NONREACTIVE

## 2024-08-04 MED ORDER — LIVING WELL WITH DIABETES BOOK - IN SPANISH
Freq: Once | Status: DC
  Filled 2024-08-04 (×2): qty 1

## 2024-08-04 MED ORDER — INSULIN STARTER KIT- PEN NEEDLES (SPANISH)
1.0000 | Freq: Once | Status: DC
  Filled 2024-08-04 (×2): qty 1

## 2024-08-04 MED ORDER — INSULIN GLARGINE-YFGN 100 UNIT/ML ~~LOC~~ SOLN
8.0000 [IU] | Freq: Every day | SUBCUTANEOUS | Status: DC
  Administered 2024-08-04 – 2024-08-05 (×2): 8 [IU] via SUBCUTANEOUS
  Filled 2024-08-04 (×3): qty 0.08

## 2024-08-04 NOTE — Progress Notes (Signed)
 Report given to receiving nurse for room 105B, patient transferred to new room with all belongings, in stable condition, and in wheelchair.

## 2024-08-04 NOTE — Progress Notes (Signed)
 Patient arrived to unit via w/c in stable condition. Ambulated with steady gait from w/c to bed. Oriented to room.

## 2024-08-04 NOTE — Plan of Care (Signed)
 Continuing with plan of care.

## 2024-08-04 NOTE — Progress Notes (Signed)
 PROGRESS NOTE    [name]  FMW:[id number] DOB: Nov 09, 1975 DOA: 08/03/2024 PCP: System, Provider Not In  Chief Complaint  Patient presents with   Medication Reaction   Polydipsia   Polyuria    Hospital Course:  Amdrew Brewer is a 48 year old male with hypertension, prediabetes, hepatitis, was recently evaluated by [facility] gastroenterology for hepatitis of unclear etiology and was placed on a 2-week empiric prednisone course.  He presents this admission with lethargy, polyuria, excessive thirst, and weight loss.  He was found to be hyperglycemic at 912, and with hyper osmolar hyperglycemic state.  He was initially admitted to the ICU on insulin drip but blood glucose quickly normalized.  He was transitioned out of the ICU on 11/29 on subcu insulin with a carb controlled diet.   Subjective: This morning patient reports he is doing well.  Denies any pain, nausea, vomiting.   Objective: Vitals:   08/04/24 0445 08/04/24 0700 08/04/24 0800 08/04/24 0900  BP:    (!) 136/96  Pulse:  60 61 (!) 59  Resp:  12 13 16   Temp: 97.8 F (36.6 C)   98.7 F (37.1 C)  TempSrc: Oral   Oral  SpO2:  100% 100% 99%  Weight:      Height:        Intake/Output Summary (Last 24 hours) at 08/04/2024 1253 Last data filed at 08/04/2024 0900 Gross per 24 hour  Intake 1802.93 ml  Output 0 ml  Net 1802.93 ml   Filed Weights   08/03/24 1113 08/03/24 1800  Weight: 86.2 kg 86.2 kg    Examination: General exam: Appears calm and comfortable, NAD  Respiratory system: No work of breathing, symmetric chest wall expansion Cardiovascular system: S1 & S2 heard, RRR.  Gastrointestinal system: Abdomen is nondistended, soft and nontender.  Neuro: Alert and oriented. No focal neurological deficits. Extremities: Symmetric, expected ROM Skin: No rashes, lesions Psychiatry: Demonstrates appropriate judgement and insight. Mood & affect appropriate for situation.   Assessment & Plan:   Principal Problem:   Type 2 diabetes mellitus with hyperosmolar hyperglycemic state (HHS) (HCC)   HHS Uncontrolled type 2 diabetes - Hemoglobin A1c 12.8% - Initial blood glucose 912 - This is a new diagnosis for this patient, previously was told he is prediabetic - He has met with diabetes educator and received guidance on insulin administration - Will continue with basal/bolus titration.  Pseudohyponatremia - Secondary to hyperglycemia.  Resolved  Hyperkalemia - Resolved - Trend CMP  Hypertension - Continue home meds amlodipine and Hyzaar  Hepatitis - Has been seen by [facility] gastroenterology. - Hepatitis workup reveals positive ANA with negative hep B, C, D.  Possible autoimmune etiology - Recently completed 2-week trial of 40 mg prednisone.  History of iron overload - Receives therapeutic phlebotomy every 2 weeks  Body mass index is 30.67 kg/m. Obesity Class I - Outpatient follow up for lifestyle modification and risk factor management   DVT prophylaxis: lovenox   Code Status: Full Code Disposition:  Inpt pending clinical resolution - hopefully home tomorrow  Consultants:    Procedures:    Antimicrobials:  Anti-infectives (From admission, onward)    None       Data Reviewed: I have personally reviewed following labs and imaging studies CBC: Recent Labs  Lab 08/03/24 1115 08/04/24 0410  WBC 5.2 6.0  HGB 13.4 12.6*  HCT 38.9* 36.0*  MCV 87.8 86.1  PLT 237 195   Basic Metabolic Panel: Recent Labs  Lab 08/03/24 1115 08/03/24 1539 08/03/24 2047 08/04/24  0410 08/04/24 0800  NA 126* 137 142 143 143  K 5.2* 3.9 3.4* 3.2* 3.3*  CL 87* 100 104 106 107  CO2 21* 24 29 28 27   GLUCOSE 912* 392* 186* 154* 148*  BUN 16 15 12 13 13   CREATININE 1.04 0.81 0.74 0.79 0.73  CALCIUM 10.0 9.9 9.4 9.1 8.9   GFR: Estimated Creatinine Clearance: 116.3 mL/min (by C-G formula based on SCr of 0.73 mg/dL). Liver Function Tests: No results for input(s): AST,  ALT, ALKPHOS, BILITOT, PROT, ALBUMIN in the last 168 hours. CBG: Recent Labs  Lab 08/03/24 2149 08/03/24 2347 08/04/24 0307 08/04/24 0728 08/04/24 1156  GLUCAP 164* 217* 150* 147* 281*    Recent Results (from the past 240 hours)  MRSA Next Gen by PCR, Nasal     Status: None   Collection Time: 08/03/24  6:04 PM   Specimen: Nasal Mucosa; Nasal Swab  Result Value Ref Range Status   MRSA by PCR Next Gen NOT DETECTED NOT DETECTED Final    Comment: (NOTE) The GeneXpert MRSA Assay (FDA approved for NASAL specimens only), is one component of a comprehensive MRSA colonization surveillance program. It is not intended to diagnose MRSA infection nor to guide or monitor treatment for MRSA infections. Test performance is not FDA approved in patients less than 63 years old. Performed at [facility], [address] [place], [place] [address]      Radiology Studies: No results found.  Scheduled Meds:  amLODipine   5 mg Oral Daily   Chlorhexidine  Gluconate Cloth  6 each Topical Q0600   enoxaparin  (LOVENOX ) injection  40 mg Subcutaneous Q24H   losartan   100 mg Oral Daily   And   hydrochlorothiazide   12.5 mg Oral Daily   insulin  aspart  0-20 Units Subcutaneous Q4H   insulin  glargine-yfgn  8 Units Subcutaneous Daily   insulin  starter kit- pen needles  1 kit Other Once   living well with diabetes book- in spanish   Does not apply Once   Continuous Infusions:   LOS: 1 day  MDM: Patient is high risk for one or more organ failure.  They necessitate ongoing hospitalization for continued IV therapies and subsequent lab monitoring. Total time spent interpreting labs and vitals, reviewing the medical record, coordinating care amongst consultants and care team members, directly assessing and discussing care with the patient and/or family: 55 min  Georgia Baria, DO [facility]  To contact the attending physician between 7A-7P please use Epic Chat. To contact the  covering physician during after hours 7P-7A, please review Amion.  08/04/2024, 12:53 PM   *This document has been created with the assistance of dictation software. Please excuse typographical errors. *

## 2024-08-05 ENCOUNTER — Other Ambulatory Visit: Payer: Self-pay

## 2024-08-05 DIAGNOSIS — E1100 Type 2 diabetes mellitus with hyperosmolarity without nonketotic hyperglycemic-hyperosmolar coma (NKHHC): Secondary | ICD-10-CM | POA: Diagnosis not present

## 2024-08-05 LAB — GLUCOSE, CAPILLARY
Glucose-Capillary: 119 mg/dL — ABNORMAL HIGH (ref 70–99)
Glucose-Capillary: 128 mg/dL — ABNORMAL HIGH (ref 70–99)
Glucose-Capillary: 196 mg/dL — ABNORMAL HIGH (ref 70–99)
Glucose-Capillary: 330 mg/dL — ABNORMAL HIGH (ref 70–99)

## 2024-08-05 LAB — COMPREHENSIVE METABOLIC PANEL WITH GFR
ALT: 221 U/L — ABNORMAL HIGH (ref 0–44)
AST: 214 U/L — ABNORMAL HIGH (ref 15–41)
Albumin: 3.7 g/dL (ref 3.5–5.0)
Alkaline Phosphatase: 101 U/L (ref 38–126)
Anion gap: 10 (ref 5–15)
BUN: 13 mg/dL (ref 6–20)
CO2: 26 mmol/L (ref 22–32)
Calcium: 9 mg/dL (ref 8.9–10.3)
Chloride: 97 mmol/L — ABNORMAL LOW (ref 98–111)
Creatinine, Ser: 0.8 mg/dL (ref 0.61–1.24)
GFR, Estimated: >60 mL/min (ref >60)
Glucose, Bld: 264 mg/dL — ABNORMAL HIGH (ref 70–99)
Potassium: 3.6 mmol/L (ref 3.5–5.1)
Sodium: 133 mmol/L — ABNORMAL LOW (ref 135–145)
Total Bilirubin: 0.5 mg/dL (ref 0.0–1.2)
Total Protein: 6.4 g/dL — ABNORMAL LOW (ref 6.5–8.1)

## 2024-08-05 MED ORDER — BLOOD GLUCOSE MONITOR SYSTEM W/DEVICE KIT
1.0000 | PACK | 0 refills | Status: AC
  Filled 2024-08-05: qty 1, 30d supply, fill #0

## 2024-08-05 MED ORDER — BASAGLAR KWIKPEN 100 UNIT/ML ~~LOC~~ SOPN
10.0000 [IU] | PEN_INJECTOR | Freq: Every day | SUBCUTANEOUS | 0 refills | Status: AC
  Filled 2024-08-05: qty 9, 90d supply, fill #0

## 2024-08-05 MED ORDER — INSUPEN PEN NEEDLES 32G X 4 MM MISC
100.0000 | Freq: Four times a day (QID) | 0 refills | Status: AC
  Filled 2024-08-05: qty 100, 25d supply, fill #0

## 2024-08-05 MED ORDER — INSULIN LISPRO (1 UNIT DIAL) 100 UNIT/ML (KWIKPEN)
0.0000 [IU] | PEN_INJECTOR | Freq: Three times a day (TID) | SUBCUTANEOUS | 0 refills | Status: AC
  Filled 2024-08-05: qty 15, 84d supply, fill #0

## 2024-08-05 MED ORDER — LANCETS MISC
1.0000 | 0 refills | Status: AC
  Filled 2024-08-05: qty 100, 25d supply, fill #0

## 2024-08-05 MED ORDER — LANCET DEVICE MISC
1.0000 | 0 refills | Status: AC
  Filled 2024-08-05: qty 1, fill #0

## 2024-08-05 MED ORDER — BAQSIMI TWO PACK 3 MG/DOSE NA POWD
3.0000 mg | NASAL | 0 refills | Status: AC | PRN
  Filled 2024-08-05 – 2024-08-06 (×2): qty 1, 1d supply, fill #0

## 2024-08-05 MED ORDER — BLOOD GLUCOSE TEST VI STRP
1.0000 | ORAL_STRIP | 0 refills | Status: AC
  Filled 2024-08-05 (×2): qty 100, 25d supply, fill #0

## 2024-08-05 NOTE — Plan of Care (Signed)

## 2024-08-05 NOTE — Discharge Summary (Signed)
 DISCHARGE SUMMARY    Marvin Brewer FMW:[id number] DOB: 08/17/1976 DOA: 08/03/2024  PCP: System, Provider Not In  Admit date: 08/03/2024 Discharge date: 08/05/2024   Recommendations for Outpatient Follow-up:  Follow up with PCP in 1-2 weeks to continue diabetes medication management   Hospital Course: Marvin Brewer is a 48 year old male with hypertension, prediabetes, hepatitis, was recently evaluated by [facility] gastroenterology for hepatitis of unclear etiology and was placed on a 2-week empiric prednisone course.  He presents this admission with lethargy, polyuria, excessive thirst, and weight loss.  He was found to be hyperglycemic at 912, and with hyper osmolar hyperglycemic state.  He was initially admitted to the ICU on insulin drip but blood glucose quickly normalized.  He was transitioned out of the ICU on 11/29 on subcu insulin with a carb controlled diet. He did well while insulin needs were titrated. By 11/30 patient completed diabetes education and felt comfortable with insulin administration at home.  He was advised to keep a blood glucose log to follow-up with his PCP for further diabetic medication titration.   HHS Uncontrolled type 2 diabetes - Hemoglobin A1c 12.8% - Initial blood glucose 912 - This is a new diagnosis for this patient, previously was told he is prediabetic - He has met with diabetes educator and received guidance on insulin administration - Insulin and diabetic supplies delivered to bedside prior to discharge   Pseudohyponatremia - Secondary to hyperglycemia.  Resolved   Hyperkalemia - Resolved - Trend CMP   Hypertension - Continue home meds   Hepatitis - Has been seen by [facility] gastroenterology.  Cont close follow-up - Hepatitis workup reveals positive ANA with negative hep B, C, D.  Possible autoimmune etiology - Recently completed 2-week trial of 40 mg prednisone.   History of iron overload - Receives therapeutic  phlebotomy every 2 weeks   Body mass index is 30.67 kg/m. Obesity Class I - Outpatient follow up for lifestyle modification and risk factor management  Discharge Instructions  Discharge Instructions     Ambulatory referral to Nutrition and Diabetic Education   Complete by: As directed    New Diagnosis Diabetes.  Insurance Claims Handler.   Call MD for:  difficulty breathing, headache or visual disturbances   Complete by: As directed    Call MD for:  persistant dizziness or light-headedness   Complete by: As directed    Call MD for:  persistant nausea and vomiting   Complete by: As directed    Call MD for:  severe uncontrolled pain   Complete by: As directed    Call MD for:  temperature >100.4   Complete by: As directed    Diet general   Complete by: As directed    Discharge instructions   Complete by: As directed    Follow up with your primary care physician to discuss the medication changes during this admission. Keep a blood glucose log to review with your PCP for further insulin changes   Increase activity slowly   Complete by: As directed       Allergies as of 08/05/2024   No Known Allergies      Medication List     STOP taking these medications    famotidine 20 MG tablet Commonly known as: PEPCID   lisinopril 10 MG tablet Commonly known as: ZESTRIL       TAKE these medications    amLODipine 5 MG tablet Commonly known as: NORVASC Take 5 mg by mouth daily.   Baqsimi Two Pack 3 MG/DOSE  Powd Generic drug: Glucagon Place 3 mg into the nose as needed for up to 2 doses (Severe low blood sugar). Give 3 mg (one actuation) into a single nostril.   Basaglar KwikPen 100 UNIT/ML Inyectar 10 unidades en la piel diariamente. Se puede sustituir segn sea necesario segn el seguro. (Inject 10 Units into the skin daily. May substitute as needed per insurance.)   Embecta Pen Needle Nano 2 Gen 32G X 4 MM Misc Generic drug: Insulin Pen Needle Utilizar 4 (cuatro) veces al  da. (Use 4 (four) times daily.)   FreeStyle Freedom Lite w/Device Kit selo segn las indicaciones hasta 4 veces al da. (Use as directed up to 4 times daily.)   freestyle lancets selo hasta cuatro veces al da segn las indicaciones. (Use up to four times daily as directed. (FOR ICD-10 E10.9, E11.9).)   FREESTYLE LITE test strip Generic drug: glucose blood selo hasta cuatro veces al da segn las indicaciones (Use up to four times daily as directed. (FOR ICD-10 E10.9, E11.9).)   hydrocortisone 2.5 % cream   insulin lispro 100 UNIT/ML KwikPen Commonly known as: HUMALOG Inject 0-6 Units into the skin 3 (three) times daily with meals. Check Blood Glucose (BG) and inject per scale: BG <150= 0 unit; BG 150-200= 1 unit; BG 201-250= 2 unit; BG 251-300= 3 unit; BG 301-350= 4 unit; BG 351-400= 5 unit; BG >400= 6 unit and Call Primary Care.   Lancet Device Misc 1 each by Does not apply route as directed. Dispense based on patient and insurance preference. Use up to four times daily as directed. (FOR ICD-10 E10.9, E11.9).   losartan-hydrochlorothiazide 100-12.5 MG tablet Commonly known as: HYZAAR Take 1 tablet by mouth daily.   milk thistle 175 MG tablet Take 175 mg by mouth daily.        No Known Allergies  Consultations:    Procedures/Studies: No results found.    Discharge Exam: Vitals:   08/05/24 0401 08/05/24 0722  BP: 118/83 118/87  Pulse: 65 65  Resp:  16  Temp: (!) 97.3 F (36.3 C) (!) 97.4 F (36.3 C)  SpO2: 98% 99%   Vitals:   08/04/24 1944 08/05/24 0000 08/05/24 0401 08/05/24 0722  BP: 122/89 113/84 118/83 118/87  Pulse: 64 60 65 65  Resp: 16 16  16   Temp: 98 F (36.7 C) 97.6 F (36.4 C) (!) 97.3 F (36.3 C) (!) 97.4 F (36.3 C)  TempSrc: Oral Oral  Oral  SpO2: 97% 97% 98% 99%  Weight:      Height:        Constitutional:  Normal appearance. Non toxic-appearing.  HENT: Head Normocephalic and atraumatic.  Mucous membranes are moist.  Eyes:   Extraocular intact. Conjunctivae normal.  Cardiovascular: Rate and Rhythm: Normal rate and regular rhythm.  Pulmonary: Non labored, symmetric rise of chest wall.  Skin: warm and dry. not jaundiced.  Neurological: No focal deficit present. alert. Oriented.  Psychiatric: Mood and Affect congruent.    The results of significant diagnostics from this hospitalization (including imaging, microbiology, ancillary and laboratory) are listed below for reference.     Microbiology: Recent Results (from the past 240 hours)  MRSA Next Gen by PCR, Nasal     Status: None   Collection Time: 08/03/24  6:04 PM   Specimen: Nasal Mucosa; Nasal Swab  Result Value Ref Range Status   MRSA by PCR Next Gen NOT DETECTED NOT DETECTED Final    Comment: (NOTE) The GeneXpert MRSA Assay (FDA approved for  NASAL specimens only), is one component of a comprehensive MRSA colonization surveillance program. It is not intended to diagnose MRSA infection nor to guide or monitor treatment for MRSA infections. Test performance is not FDA approved in patients less than 28 years old. Performed at [facility], [address] [place], [place] [address]      Labs: BNP (last 3 results) No results for input(s): BNP in the last 8760 hours. Basic Metabolic Panel: Recent Labs  Lab 08/03/24 1539 08/03/24 2047 08/04/24 0410 08/04/24 0800 08/05/24 0905  NA 137 142 143 143 133*  K 3.9 3.4* 3.2* 3.3* 3.6  CL 100 104 106 107 97*  CO2 24 29 28 27 26   GLUCOSE 392* 186* 154* 148* 264*  BUN 15 12 13 13 13   CREATININE 0.81 0.74 0.79 0.73 0.80  CALCIUM 9.9 9.4 9.1 8.9 9.0   Liver Function Tests: Recent Labs  Lab 08/05/24 0905  AST 214*  ALT 221*  ALKPHOS 101  BILITOT 0.5  PROT 6.4*  ALBUMIN 3.7   No results for input(s): LIPASE, AMYLASE in the last 168 hours. No results for input(s): AMMONIA in the last 168 hours. CBC: Recent Labs  Lab 08/03/24 1115 08/04/24 0410  WBC 5.2 6.0  HGB 13.4 12.6*   HCT 38.9* 36.0*  MCV 87.8 86.1  PLT 237 195   Cardiac Enzymes: No results for input(s): CKTOTAL, CKMB, CKMBINDEX, TROPONINI in the last 168 hours. BNP: Invalid input(s): POCBNP CBG: Recent Labs  Lab 08/04/24 2358 08/05/24 0359 08/05/24 0413 08/05/24 0724 08/05/24 1131  GLUCAP 147* 119* 128* 196* 330*   D-Dimer No results for input(s): DDIMER in the last 72 hours. Hgb A1c Recent Labs    08/03/24 1115  HGBA1C 12.8*   Lipid Profile No results for input(s): CHOL, HDL, LDLCALC, TRIG, CHOLHDL, LDLDIRECT in the last 72 hours. Thyroid function studies No results for input(s): TSH, T4TOTAL, T3FREE, THYROIDAB in the last 72 hours.  Invalid input(s): FREET3 Anemia work up No results for input(s): VITAMINB12, FOLATE, FERRITIN, TIBC, IRON, RETICCTPCT in the last 72 hours. Urinalysis    Component Value Date/Time   COLORURINE COLORLESS (A) 08/03/2024 1156   APPEARANCEUR CLEAR (A) 08/03/2024 1156   LABSPEC 1.027 08/03/2024 1156   PHURINE 6.0 08/03/2024 1156   GLUCOSEU >=500 (A) 08/03/2024 1156   HGBUR NEGATIVE 08/03/2024 1156   BILIRUBINUR NEGATIVE 08/03/2024 1156   KETONESUR 5 (A) 08/03/2024 1156   PROTEINUR NEGATIVE 08/03/2024 1156   NITRITE NEGATIVE 08/03/2024 1156   LEUKOCYTESUR NEGATIVE 08/03/2024 1156   Sepsis Labs Recent Labs  Lab 08/03/24 1115 08/04/24 0410  WBC 5.2 6.0   Microbiology Recent Results (from the past 240 hours)  MRSA Next Gen by PCR, Nasal     Status: None   Collection Time: 08/03/24  6:04 PM   Specimen: Nasal Mucosa; Nasal Swab  Result Value Ref Range Status   MRSA by PCR Next Gen NOT DETECTED NOT DETECTED Final    Comment: (NOTE) The GeneXpert MRSA Assay (FDA approved for NASAL specimens only), is one component of a comprehensive MRSA colonization surveillance program. It is not intended to diagnose MRSA infection nor to guide or monitor treatment for MRSA infections. Test performance is not  FDA approved in patients less than 67 years old. Performed at [facility], [address], [place], [place] [address]      Time coordinating discharge: 32 min   SIGNED: Lorane Poland, DO [facility] 08/05/2024, 12:34 PM Pager   If 7PM-7AM, please contact  night-coverage

## 2024-08-06 ENCOUNTER — Other Ambulatory Visit: Payer: Self-pay

## 2024-08-08 ENCOUNTER — Other Ambulatory Visit: Payer: Self-pay

## 2024-09-04 ENCOUNTER — Encounter: Attending: Internal Medicine | Admitting: Dietician

## 2024-09-04 ENCOUNTER — Encounter: Payer: Self-pay | Admitting: Dietician

## 2024-09-04 DIAGNOSIS — E1100 Type 2 diabetes mellitus with hyperosmolarity without nonketotic hyperglycemic-hyperosmolar coma (NKHHC): Secondary | ICD-10-CM | POA: Diagnosis present

## 2024-09-04 DIAGNOSIS — Z713 Dietary counseling and surveillance: Secondary | ICD-10-CM | POA: Diagnosis not present

## 2024-09-04 NOTE — Patient Instructions (Addendum)
 Choose less fried options when eating out for lunch. Work towards eating three meals a day, about 5-6 hours apart! Begin to recognize carbohydrates, proteins, and non-starchy vegetables in your food choices! Begin to build your meals using the proportions of the Balanced Plate. First, select your carb choice(s) for the meal. Make this 25% of your meal. Next, select your source of protein to pair with your carb choice(s). Make this another 25% of your meal. Choose LEAN (Low fat or fat free) proteins Finally, complete your meal with a variety of non-starchy vegetables. Make this the remaining 50% of your meal.

## 2024-09-04 NOTE — Progress Notes (Signed)
 Diabetes Self-Management Education  Visit Type: First/Initial  Appt. Start Time: 1420 Appt. End Time: 1510  09/04/2024  [name], identified by name and date of birth, is a 48 y.o. male with a diagnosis of Diabetes: Type 2.   ASSESSMENT Spanish interpreter, Sherron Boos, present for appointment from [facility] Pt reports being admitted to ED for [facility] in late November after taking course of prednisone for hepatitis, states he was prescribed two courses of prednisone by two different providers. Pt reports symptoms of dysgeusia, polyuria, polydipsia, blurry vision prior to going to hospital. Pt reports weight loss of ~10 lbs a/w [facility], states all food tasted poorly, has since gained weight back to [facility]. Pt reports taking Glargine @10u  daily, was prescribed Humalog  @0 -6u sliding scale w/ meals after ED but has not needed to take it in last 2 weeks due to glucose being under 150 mg/dL prior to meals, reports typical fasting numbers between 80-100 mg/dL currently. Pt reports breakfast and dinner are at home, will eat out for lunch during work week. 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   Diabetes Self-Management Education - 09/04/24 1429       Visit Information   Visit Type First/Initial      Initial Visit   Diabetes Type Type 2    Date Diagnosed Novemeber, 2025    Are you currently following a meal plan? No    Are you taking your medications as prescribed? Yes      Health Coping   How would you rate your overall health? Poor      Psychosocial Assessment   Patient Belief/Attitude about Diabetes Other (comment)   Normal   What is the hardest part about your diabetes right now, causing you the most concern, or is the most worrisome to you about your diabetes?   Other (comment)   None   Self-care barriers English as a second language    Self-management support Doctor's office    Other persons present Patient;Interpreter     Patient Concerns Other (comment)    Special Needs None    Preferred Learning Style Visual    Learning Readiness Change in progress    How often do you need to have someone help you when you read instructions, pamphlets, or other written materials from your doctor or pharmacy? 5 - Always    What is the last grade level you completed in school? Primary school      Pre-Education Assessment   Patient understands the diabetes disease and treatment process. Needs Instruction    Patient understands incorporating nutritional management into lifestyle. Needs Instruction    Patient undertands incorporating physical activity into lifestyle. Needs Instruction    Patient understands using medications safely. Needs Instruction    Patient understands monitoring blood glucose, interpreting and using results Needs Instruction    Patient understands prevention, detection, and treatment of acute complications. Needs Instruction    Patient understands prevention, detection, and treatment of chronic complications. Needs Instruction    Patient understands how to develop strategies to address psychosocial issues. Needs Instruction    Patient understands how to develop strategies to promote health/change behavior. Needs Instruction      Complications   Last HgB A1C per patient/outside source 12.8 %   08/03/2024   How often do you check your blood sugar? 3-4 times/day    Fasting Blood glucose range (mg/dL) 29-870    Have you had a dilated eye exam in  the past 12 months? No    Have you had a dental exam in the past 12 months? Yes    Are you checking your feet? Yes    How many days per week are you checking your feet? 7      Dietary Intake   Breakfast 2 eggs, 2 cactus leaves, 2 corn tortilla, water    Lunch 2 pieces of fried chicken, broccoli, Water    Dinner Grilled steak, green beans, mashed potatoes w/ gravy, Water    Beverage(s) Water      Activity / Exercise   Activity / Exercise Type ADL's    How  many days per week do you exercise? 0    How many minutes per day do you exercise? 0    Total minutes per week of exercise 0      Patient Education   Previous Diabetes Education No    Disease Pathophysiology Factors that contribute to the development of diabetes;Definition of diabetes, type 1 and 2, and the diagnosis of diabetes   High dose steroi medications   Healthy Eating Role of diet in the treatment of diabetes and the relationship between the three main macronutrients and blood glucose level;Plate Method;Information on hints to eating out and maintain blood glucose control.    Being Active Identified with patient nutritional and/or medication changes necessary with exercise.    Medications Taught/reviewed insulin /injectables, injection, site rotation, insulin /injectables storage and needle disposal.;Reviewed patients medication for diabetes, action, purpose, timing of dose and side effects.    Monitoring Identified appropriate SMBG and/or A1C goals.    Acute complications Taught prevention, symptoms, and  treatment of hypoglycemia - the 15 rule.;Discussed and identified patients' prevention, symptoms, and treatment of hyperglycemia.    Chronic complications Relationship between chronic complications and blood glucose control    Diabetes Stress and Support Identified and addressed patients feelings and concerns about diabetes      Individualized Goals (developed by patient)   Nutrition Follow meal plan discussed    Physical Activity Not Applicable    Medications take my medication as prescribed    Monitoring  Test my blood glucose as discussed    Reducing Risk examine blood glucose patterns      Post-Education Assessment   Patient understands the diabetes disease and treatment process. Comprehends key points    Patient understands incorporating nutritional management into lifestyle. Comprehends key points    Patient undertands incorporating physical activity into lifestyle. Needs  Review    Patient understands using medications safely. Demonstrates understanding / competency    Patient understands monitoring blood glucose, interpreting and using results Comprehends key points    Patient understands prevention, detection, and treatment of acute complications. Comprehends key points    Patient understands prevention, detection, and treatment of chronic complications. Comprehends key points    Patient understands how to develop strategies to address psychosocial issues. Comprehends key points    Patient understands how to develop strategies to promote health/change behavior. Comprehends key points      Outcomes   Expected Outcomes Demonstrated interest in learning. Expect positive outcomes    Future DMSE PRN    Program Status Not Completed          Individualized Plan for Diabetes Self-Management Training:   Learning Objective:  Patient will have a greater understanding of diabetes self-management. Patient education plan is to attend individual and/or group sessions per assessed needs and concerns.   Plan:   Patient Instructions  Choose less fried options when  eating out for lunch. Work towards eating three meals a day, about 5-6 hours apart! Begin to recognize carbohydrates, proteins, and non-starchy vegetables in your food choices! Begin to build your meals using the proportions of the Balanced Plate. First, select your carb choice(s) for the meal. Make this 25% of your meal. Next, select your source of protein to pair with your carb choice(s). Make this another 25% of your meal. Choose LEAN (Low fat or fat free) proteins Finally, complete your meal with a variety of non-starchy vegetables. Make this the remaining 50% of your meal.  Expected Outcomes:  Demonstrated interest in learning. Expect positive outcomes  Education material provided: Diabetes Resource Guide (Spanish)  If problems or questions, patient to contact team via:  Phone and Email  Future  DSME appointment: PRN
